# Patient Record
Sex: Male | Born: 1975 | Race: Black or African American | Hispanic: No | Smoking: Former smoker
Health system: Southern US, Community
[De-identification: ages and names within clinical notes are randomized; demographics above are authoritative.]

## PROBLEM LIST (undated history)

## (undated) DIAGNOSIS — I10 Essential (primary) hypertension: Secondary | ICD-10-CM

## (undated) DIAGNOSIS — E1140 Type 2 diabetes mellitus with diabetic neuropathy, unspecified: Secondary | ICD-10-CM

## (undated) DIAGNOSIS — F419 Anxiety disorder, unspecified: Secondary | ICD-10-CM

## (undated) DIAGNOSIS — E7800 Pure hypercholesterolemia, unspecified: Secondary | ICD-10-CM

## (undated) HISTORY — PX: TIBIA FRACTURE SURGERY: SHX806

---

## 2009-08-07 ENCOUNTER — Emergency Department (HOSPITAL_COMMUNITY): Admission: EM | Admit: 2009-08-07 | Discharge: 2009-08-07 | Payer: Self-pay | Admitting: Emergency Medicine

## 2009-11-02 ENCOUNTER — Emergency Department (HOSPITAL_COMMUNITY): Admission: EM | Admit: 2009-11-02 | Discharge: 2009-11-02 | Payer: Self-pay | Admitting: Emergency Medicine

## 2010-04-06 LAB — POCT I-STAT, CHEM 8
Hemoglobin: 17.3 g/dL — ABNORMAL HIGH (ref 13.0–17.0)
Sodium: 140 mEq/L (ref 135–145)
TCO2: 31 mmol/L (ref 0–100)

## 2010-04-06 LAB — URINALYSIS, ROUTINE W REFLEX MICROSCOPIC
Glucose, UA: NEGATIVE mg/dL
Nitrite: NEGATIVE
Urobilinogen, UA: 0.2 mg/dL (ref 0.0–1.0)
pH: 6.5 (ref 5.0–8.0)

## 2010-04-06 LAB — GLUCOSE, CAPILLARY: Glucose-Capillary: 214 mg/dL — ABNORMAL HIGH (ref 70–99)

## 2010-04-08 LAB — POCT I-STAT, CHEM 8
Calcium, Ion: 1.11 mmol/L — ABNORMAL LOW (ref 1.12–1.32)
Hemoglobin: 18 g/dL — ABNORMAL HIGH (ref 13.0–17.0)
Sodium: 137 mEq/L (ref 135–145)
TCO2: 27 mmol/L (ref 0–100)

## 2010-04-08 LAB — RAPID STREP SCREEN (MED CTR MEBANE ONLY): Streptococcus, Group A Screen (Direct): POSITIVE — AB

## 2011-02-18 ENCOUNTER — Emergency Department (HOSPITAL_COMMUNITY): Payer: Self-pay

## 2011-02-18 ENCOUNTER — Other Ambulatory Visit: Payer: Self-pay

## 2011-02-18 ENCOUNTER — Emergency Department (HOSPITAL_COMMUNITY)
Admission: EM | Admit: 2011-02-18 | Discharge: 2011-02-18 | Disposition: A | Discharge status: Home / Self Care | Payer: Self-pay | Attending: Emergency Medicine | Admitting: Emergency Medicine

## 2011-02-18 ENCOUNTER — Encounter (HOSPITAL_COMMUNITY): Payer: Self-pay | Admitting: Emergency Medicine

## 2011-02-18 DIAGNOSIS — J3489 Other specified disorders of nose and nasal sinuses: Secondary | ICD-10-CM | POA: Insufficient documentation

## 2011-02-18 DIAGNOSIS — Z79899 Other long term (current) drug therapy: Secondary | ICD-10-CM | POA: Insufficient documentation

## 2011-02-18 DIAGNOSIS — I10 Essential (primary) hypertension: Secondary | ICD-10-CM | POA: Insufficient documentation

## 2011-02-18 DIAGNOSIS — E7800 Pure hypercholesterolemia, unspecified: Secondary | ICD-10-CM | POA: Insufficient documentation

## 2011-02-18 DIAGNOSIS — R110 Nausea: Secondary | ICD-10-CM | POA: Insufficient documentation

## 2011-02-18 DIAGNOSIS — R079 Chest pain, unspecified: Secondary | ICD-10-CM | POA: Insufficient documentation

## 2011-02-18 DIAGNOSIS — Z7982 Long term (current) use of aspirin: Secondary | ICD-10-CM | POA: Insufficient documentation

## 2011-02-18 DIAGNOSIS — E119 Type 2 diabetes mellitus without complications: Secondary | ICD-10-CM | POA: Insufficient documentation

## 2011-02-18 DIAGNOSIS — R002 Palpitations: Secondary | ICD-10-CM | POA: Insufficient documentation

## 2011-02-18 HISTORY — DX: Pure hypercholesterolemia, unspecified: E78.00

## 2011-02-18 HISTORY — DX: Essential (primary) hypertension: I10

## 2011-02-18 LAB — BASIC METABOLIC PANEL
Calcium: 9.8 mg/dL (ref 8.4–10.5)
GFR calc non Af Amer: 75 mL/min — ABNORMAL LOW (ref >90)
Glucose, Bld: 80 mg/dL (ref 70–99)
Potassium: 3.8 mEq/L (ref 3.5–5.1)
Sodium: 142 mEq/L (ref 135–145)

## 2011-02-18 LAB — DIFFERENTIAL
Basophils Absolute: 0.1 10*3/uL (ref 0.0–0.1)
Eosinophils Absolute: 0.1 10*3/uL (ref 0.0–0.7)
Lymphocytes Relative: 40 % (ref 12–46)
Lymphs Abs: 2.7 10*3/uL (ref 0.7–4.0)
Neutrophils Relative %: 49 % (ref 43–77)

## 2011-02-18 LAB — POCT I-STAT TROPONIN I: Troponin i, poc: 0 ng/mL (ref 0.00–0.08)

## 2011-02-18 LAB — CBC
MCH: 29.9 pg (ref 26.0–34.0)
Platelets: 242 10*3/uL (ref 150–400)
RBC: 5.66 MIL/uL (ref 4.22–5.81)
RDW: 13.1 % (ref 11.5–15.5)
WBC: 6.7 10*3/uL (ref 4.0–10.5)

## 2011-02-18 LAB — GLUCOSE, CAPILLARY: Glucose-Capillary: 78 mg/dL (ref 70–99)

## 2011-02-18 NOTE — ED Provider Notes (Signed)
History     CSN: [id number]  Arrival date & time 02/18/11  2129   First MD Initiated Contact with Patient 02/18/11 2157      Chief Complaint  Patient presents with  . Chest Pain    (Consider location/radiation/quality/duration/timing/severity/associated sxs/prior treatment) Patient is a 36 y.o. male presenting with palpitations. The history is provided by the patient.  Palpitations  This is a chronic problem. The current episode started 3 to 5 hours ago. Episode frequency: 2-3 times per day. The problem has been resolved. The problem is associated with an unknown factor. Associated symptoms include nausea. Pertinent negatives include no fever, no chest pain, no abdominal pain, no vomiting and no cough. He has tried nothing for the symptoms.    Past Medical History  Diagnosis Date  . Hypertension   . Diabetes mellitus   . Hypercholesteremia     History reviewed. No pertinent past surgical history.  No family history on file.  History  Substance Use Topics  . Smoking status: Current Everyday Smoker  . Smokeless tobacco: Not on file  . Alcohol Use: Yes      Review of Systems  Constitutional: Negative for fever.  HENT: Positive for congestion. Negative for facial swelling and trouble swallowing.   Respiratory: Negative for cough.   Cardiovascular: Positive for palpitations. Negative for chest pain.  Gastrointestinal: Positive for nausea. Negative for vomiting, abdominal pain and diarrhea.  Genitourinary: Negative for difficulty urinating.  Skin: Negative for rash.  All other systems reviewed and are negative.    Allergies  Review of patient's allergies indicates no known allergies.  Home Medications   Current Outpatient Rx  Name Route Sig Dispense Refill  . VITAMIN C PO Oral Take 1 tablet by mouth daily.    . ASPIRIN 325 MG PO TBEC Oral Take 325 mg by mouth daily.    Marland Kitchen GLIPIZIDE 5 MG PO TABS Oral Take 5 mg by mouth 2 (two) times daily before a meal.    .  LISINOPRIL 10 MG PO TABS Oral Take 10 mg by mouth daily.    Marland Kitchen METFORMIN HCL 500 MG PO TABS Oral Take 500 mg by mouth 2 (two) times daily with a meal.      BP 124/74  Pulse 89  Temp(Src) 97.8 F (36.6 C) (Oral)  Resp 16  SpO2 100%  Physical Exam  Nursing note and vitals reviewed. Constitutional: He is oriented to person, place, and time. He appears well-developed and well-nourished. No distress.  HENT:  Head: Normocephalic and atraumatic.  Mouth/Throat: Oropharynx is clear and moist.  Eyes: Conjunctivae are normal. Pupils are equal, round, and reactive to light. No scleral icterus.  Neck: Normal range of motion. Neck supple.  Cardiovascular: Normal rate, regular rhythm, normal heart sounds and intact distal pulses.   No murmur heard. Pulmonary/Chest: Effort normal and breath sounds normal. No stridor. No respiratory distress. He has no wheezes. He has no rales.  Abdominal: Soft. He exhibits no distension. There is no tenderness.  Musculoskeletal: Normal range of motion. He exhibits no edema.  Neurological: He is alert and oriented to person, place, and time.  Skin: Skin is warm and dry. No rash noted.  Psychiatric: He has a normal mood and affect. His behavior is normal.    ED Course  Procedures (including critical care time)  Labs Reviewed  BASIC METABOLIC PANEL - Abnormal; Notable for the following:    GFR calc non Af Amer 75 (*)    GFR calc Af Amer 87 (*)  All other components within normal limits  CBC  DIFFERENTIAL  GLUCOSE, CAPILLARY  POCT I-STAT TROPONIN I  I-STAT TROPONIN I   Dg Chest 2 View  02/18/2011  *RADIOLOGY REPORT*  Clinical Data: Cough, congestion, history smoking, hypertension, diabetes  CHEST - 2 VIEW  Comparison: None  Findings: Normal heart size, mediastinal contours, and pulmonary vascularity. Lungs clear. Bones unremarkable. No pneumothorax.  IMPRESSION: Normal exam.  Original Report Authenticated By: Lollie Marrow, M.D.    Date: 02/19/2011  Rate:  83  Rhythm: normal sinus rhythm  QRS Axis: normal  Intervals: normal  ST/T Wave abnormalities: nonspecific ST/T changes  Conduction Disutrbances:none  Narrative Interpretation:   Old EKG Reviewed: none available     1. Palpitations       MDM  36 yo male with hx of palpitations presenting after an episode which occurred a few hours prior to arrival.  Stated he felt that his heart was skipping beats, he then felt heavy chested.  Entire episode lasted seconds and then completely resolved.  On arrival, he was well appearing with stable vitals.  EKG showed nonspecific ST changes.  Labwork obtained in triage was unremarkable including negative troponin.  CXR also normal.  Based on patient's symptoms and presentation, do not suspect ACS.  Patient has had an initial evaluation for these palpitations by a cardiologist, but he did not follow up regarding results of testing.  Feel that he needs follow up and have provided numbers for Cardiology and PCP.  Return precautions given.  DC'd home.    Discussed findings and plan with attending, Dr. Alvester Morin, MD 02/19/11 [phone]

## 2011-02-18 NOTE — ED Provider Notes (Signed)
Complains of irregular heartbeat i.e. skipped beats had 3 or 4 episodes today lasting a split second each also has pain in his left shoulder intermittent which lasts a split second at a time no other complaint. Patient presently asymptomatic. Has had cardiac evaluation including even monitor in the past however he did not follow through with the evaluation and did not get results. Tandem alert awake Glasgow Coma Score 15 lungs clear auscultation heart regular rate and rhythm no murmurs  Doug Sou, MD 02/18/11 2335

## 2011-02-18 NOTE — ED Notes (Signed)
Pt states he has been experiencing midsternal CP that radiates to left shoulder and SOB around 1855.  Pt does state he has acid reflux.  Pt stated he has been feeling "fluttering" in his heart for the past six months to a year.  States he has difficulty catching his breath when he has these episode.  Pt took glipiside and metformin and a bayer asprin today at 2057.

## 2011-02-18 NOTE — ED Notes (Signed)
PT. REPORTS LEFT CHEST PAIN RADIATING TO LEFT SHOULDER / LEFT ARM WITH PALPITATIONS  FOR SEVERAL MONTHS .

## 2011-02-19 NOTE — ED Provider Notes (Signed)
I have personally seen and examined the patient.  I have discussed the plan of care with the resident.  I have reviewed the documentation on PMH/FH/Soc. History.  I have reviewed the documentation of the resident and agree.  Doug Sou, MD 02/19/11 [phone]

## 2012-01-03 ENCOUNTER — Emergency Department (HOSPITAL_BASED_OUTPATIENT_CLINIC_OR_DEPARTMENT_OTHER)
Admission: EM | Admit: 2012-01-03 | Discharge: 2012-01-03 | Disposition: A | Discharge status: Home / Self Care | Payer: Self-pay | Attending: Emergency Medicine | Admitting: Emergency Medicine

## 2012-01-03 ENCOUNTER — Encounter (HOSPITAL_BASED_OUTPATIENT_CLINIC_OR_DEPARTMENT_OTHER): Payer: Self-pay | Admitting: *Deleted

## 2012-01-03 ENCOUNTER — Emergency Department (HOSPITAL_BASED_OUTPATIENT_CLINIC_OR_DEPARTMENT_OTHER): Payer: Self-pay

## 2012-01-03 DIAGNOSIS — M549 Dorsalgia, unspecified: Secondary | ICD-10-CM

## 2012-01-03 DIAGNOSIS — E119 Type 2 diabetes mellitus without complications: Secondary | ICD-10-CM | POA: Insufficient documentation

## 2012-01-03 DIAGNOSIS — R05 Cough: Secondary | ICD-10-CM | POA: Insufficient documentation

## 2012-01-03 DIAGNOSIS — R059 Cough, unspecified: Secondary | ICD-10-CM | POA: Insufficient documentation

## 2012-01-03 DIAGNOSIS — I10 Essential (primary) hypertension: Secondary | ICD-10-CM | POA: Insufficient documentation

## 2012-01-03 DIAGNOSIS — F17200 Nicotine dependence, unspecified, uncomplicated: Secondary | ICD-10-CM | POA: Insufficient documentation

## 2012-01-03 DIAGNOSIS — M545 Low back pain, unspecified: Secondary | ICD-10-CM | POA: Insufficient documentation

## 2012-01-03 DIAGNOSIS — Z79899 Other long term (current) drug therapy: Secondary | ICD-10-CM | POA: Insufficient documentation

## 2012-01-03 DIAGNOSIS — Z7982 Long term (current) use of aspirin: Secondary | ICD-10-CM | POA: Insufficient documentation

## 2012-01-03 DIAGNOSIS — IMO0001 Reserved for inherently not codable concepts without codable children: Secondary | ICD-10-CM | POA: Insufficient documentation

## 2012-01-03 DIAGNOSIS — E7800 Pure hypercholesterolemia, unspecified: Secondary | ICD-10-CM | POA: Insufficient documentation

## 2012-01-03 LAB — URINALYSIS, ROUTINE W REFLEX MICROSCOPIC
Bilirubin Urine: NEGATIVE
Hgb urine dipstick: NEGATIVE
Ketones, ur: NEGATIVE mg/dL
Nitrite: NEGATIVE
Protein, ur: NEGATIVE mg/dL
Specific Gravity, Urine: 1.029 (ref 1.005–1.030)
Urobilinogen, UA: 0.2 mg/dL (ref 0.0–1.0)

## 2012-01-03 MED ORDER — HYDROCODONE-ACETAMINOPHEN 5-325 MG PO TABS: 2.0000 | ORAL_TABLET | ORAL | Status: DC | PRN

## 2012-01-03 NOTE — ED Provider Notes (Signed)
Medical screening examination/treatment/procedure(s) were performed by non-physician practitioner and as supervising physician I was immediately available for consultation/collaboration.   Carleene Cooper III, MD 01/03/12 [phone]

## 2012-01-03 NOTE — ED Provider Notes (Signed)
History     CSN: [id number]  Arrival date & time 01/03/12  1431   First MD Initiated Contact with Patient 01/03/12 1456      Chief Complaint  Patient presents with  . Back Pain    (Consider location/radiation/quality/duration/timing/severity/associated sxs/prior treatment) HPI Comments: Pt states that he has intermittent history of back pain in lower back over the last couple of years:pt states that he is having some generalized myalgias, cough and back pain  Patient is a 36 y.o. male presenting with back pain. The history is provided by the patient. No language interpreter was used.  Back Pain  This is a chronic problem. The current episode started more than 1 week ago. The problem occurs constantly. The problem has not changed since onset.The pain is associated with no known injury. The pain is present in the lumbar spine. The quality of the pain is described as aching. The pain does not radiate. The pain is moderate. The symptoms are aggravated by bending. The pain is the same all the time. Pertinent negatives include no numbness, no bowel incontinence, no perianal numbness, no dysuria, no paresis, no tingling and no weakness. He has tried nothing for the symptoms.    Past Medical History  Diagnosis Date  . Hypertension   . Diabetes mellitus   . Hypercholesteremia     History reviewed. No pertinent past surgical history.  History reviewed. No pertinent family history.  History  Substance Use Topics  . Smoking status: Current Every Day Smoker  . Smokeless tobacco: Not on file  . Alcohol Use: Yes      Review of Systems  Constitutional: Negative for fatigue.  Respiratory: Positive for cough.   Cardiovascular: Negative.   Gastrointestinal: Negative for bowel incontinence.  Genitourinary: Negative for dysuria.  Musculoskeletal: Positive for back pain.  Neurological: Negative for tingling, weakness and numbness.    Allergies  Review of patient's allergies indicates  no known allergies.  Home Medications   Current Outpatient Rx  Name  Route  Sig  Dispense  Refill  . VITAMIN C PO   Oral   Take 1 tablet by mouth daily.         . ASPIRIN 325 MG PO TBEC   Oral   Take 325 mg by mouth daily.         Marland Kitchen GLIPIZIDE 5 MG PO TABS   Oral   Take 5 mg by mouth 2 (two) times daily before a meal.         . LISINOPRIL 10 MG PO TABS   Oral   Take 10 mg by mouth daily.         Marland Kitchen METFORMIN HCL 500 MG PO TABS   Oral   Take 500 mg by mouth 2 (two) times daily with a meal.           BP 130/78  Pulse 87  Temp 98 F (36.7 C) (Oral)  Resp 16  SpO2 98%  Physical Exam  Constitutional: He is oriented to person, place, and time. He appears well-developed and well-nourished.  HENT:  Head: Normocephalic and atraumatic.  Right Ear: External ear normal.  Left Ear: External ear normal.  Nose: Nose normal.  Eyes: Conjunctivae normal and EOM are normal.  Neck: Neck supple.  Pulmonary/Chest: Effort normal and breath sounds normal.  Abdominal: Soft. Bowel sounds are normal. There is no tenderness.  Musculoskeletal:       Lumbar paraspinal tenderness:pt has full rom:pt has equal strength in lower extremities  Neurological: He is alert and oriented to person, place, and time.  Skin: Skin is warm and dry.  Psychiatric: He has a normal mood and affect.    ED Course  Procedures (including critical care time)   Labs Reviewed  URINALYSIS, ROUTINE W REFLEX MICROSCOPIC   Dg Chest 2 View  01/03/2012  *RADIOLOGY REPORT*  Clinical Data: Cough, smoking history, low back pain  CHEST - 2 VIEW  Comparison: Chest x-ray of 02/18/2011  Findings: No active infiltrate or effusion is seen.  Mediastinal contours are stable.  The heart is stable in size and configuration.  No bony abnormality is seen.  IMPRESSION: Stable chest x-ray.  No active lung disease.   Original Report Authenticated By: Dwyane Dee, M.D.      1. Back pain       MDM  Pt is okay to follow  up with pcp:pt not having any neuro deficits        Teressa Lower, NP 01/03/12 1613

## 2012-01-03 NOTE — ED Notes (Signed)
Patient transported to X-ray 

## 2012-01-03 NOTE — ED Notes (Signed)
Pt denies any injury or trauma, denies any fevers or any urinary symptoms. States this is the same low back pain he always has, "only worse".

## 2012-01-03 NOTE — ED Notes (Signed)
BS 150

## 2012-01-03 NOTE — ED Notes (Signed)
NP at bedside.

## 2012-01-03 NOTE — ED Notes (Signed)
Pt amb to triage with quick steady gait in nad. 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 Last night started having lbp in the middle of his back.

## 2012-06-20 ENCOUNTER — Emergency Department (HOSPITAL_BASED_OUTPATIENT_CLINIC_OR_DEPARTMENT_OTHER)
Admission: EM | Admit: 2012-06-20 | Discharge: 2012-06-20 | Disposition: A | Discharge status: Home / Self Care | Payer: Medicaid Other | Attending: Emergency Medicine | Admitting: Emergency Medicine

## 2012-06-20 ENCOUNTER — Encounter (HOSPITAL_BASED_OUTPATIENT_CLINIC_OR_DEPARTMENT_OTHER): Payer: Self-pay | Admitting: *Deleted

## 2012-06-20 DIAGNOSIS — Z862 Personal history of diseases of the blood and blood-forming organs and certain disorders involving the immune mechanism: Secondary | ICD-10-CM | POA: Insufficient documentation

## 2012-06-20 DIAGNOSIS — F17200 Nicotine dependence, unspecified, uncomplicated: Secondary | ICD-10-CM | POA: Insufficient documentation

## 2012-06-20 DIAGNOSIS — Z79899 Other long term (current) drug therapy: Secondary | ICD-10-CM | POA: Insufficient documentation

## 2012-06-20 DIAGNOSIS — I10 Essential (primary) hypertension: Secondary | ICD-10-CM | POA: Insufficient documentation

## 2012-06-20 DIAGNOSIS — Z8639 Personal history of other endocrine, nutritional and metabolic disease: Secondary | ICD-10-CM | POA: Insufficient documentation

## 2012-06-20 DIAGNOSIS — R599 Enlarged lymph nodes, unspecified: Secondary | ICD-10-CM | POA: Insufficient documentation

## 2012-06-20 DIAGNOSIS — R5383 Other fatigue: Secondary | ICD-10-CM | POA: Insufficient documentation

## 2012-06-20 DIAGNOSIS — Z7982 Long term (current) use of aspirin: Secondary | ICD-10-CM | POA: Insufficient documentation

## 2012-06-20 DIAGNOSIS — J020 Streptococcal pharyngitis: Secondary | ICD-10-CM

## 2012-06-20 DIAGNOSIS — E119 Type 2 diabetes mellitus without complications: Secondary | ICD-10-CM | POA: Insufficient documentation

## 2012-06-20 DIAGNOSIS — R5381 Other malaise: Secondary | ICD-10-CM | POA: Insufficient documentation

## 2012-06-20 LAB — RAPID STREP SCREEN (MED CTR MEBANE ONLY): Streptococcus, Group A Screen (Direct): POSITIVE — AB

## 2012-06-20 MED ORDER — AMOXICILLIN 500 MG PO CAPS: 500.0000 mg | ORAL_CAPSULE | Freq: Three times a day (TID) | ORAL | Status: DC

## 2012-06-20 NOTE — ED Provider Notes (Signed)
History     CSN: [id number]  Arrival date & time 06/20/12  1949   First MD Initiated Contact with Patient 06/20/12 2012      Chief Complaint  Patient presents with  . Sore Throat    (Consider location/radiation/quality/duration/timing/severity/associated sxs/prior treatment) HPI Comments: 37 year old male presents emergency department complaining of sore throat x2 days. States the pain has been progressively worsening for the past 2 days and is beginning to feel tired. He has tried to use throat spray and throat drops without relief. Denies difficulty breathing, swallowing or voice change. He feels as if his glands are swollen. States his father's girlfriend has been sick recently and he shared a cigarette near her the other day. Denies fever, chills, headache, ear pain or any other symptoms.  Patient is a 37 y.o. male presenting with pharyngitis. The history is provided by the patient.  Sore Throat Associated symptoms include fatigue and a sore throat. Pertinent negatives include no chills, fever or neck pain.    Past Medical History  Diagnosis Date  . Hypertension   . Diabetes mellitus   . Hypercholesteremia     History reviewed. No pertinent past surgical history.  History reviewed. No pertinent family history.  History  Substance Use Topics  . Smoking status: Current Some Day Smoker  . Smokeless tobacco: Not on file  . Alcohol Use: Yes      Review of Systems  Constitutional: Positive for fatigue. Negative for fever and chills.  HENT: Positive for sore throat. Negative for ear pain, trouble swallowing, neck pain, neck stiffness and voice change.   Hematological: Positive for adenopathy.  All other systems reviewed and are negative.    Allergies  Review of patient's allergies indicates no known allergies.  Home Medications   Current Outpatient Rx  Name  Route  Sig  Dispense  Refill  . amoxicillin (AMOXIL) 500 MG capsule   Oral   Take 1 capsule (500 mg  total) by mouth 3 (three) times daily.   21 capsule   0   . Ascorbic Acid (VITAMIN C PO)   Oral   Take 1 tablet by mouth daily.         Marland Kitchen aspirin 325 MG EC tablet   Oral   Take 325 mg by mouth daily.         Marland Kitchen glipiZIDE (GLUCOTROL) 5 MG tablet   Oral   Take 5 mg by mouth 2 (two) times daily before a meal.         . HYDROcodone-acetaminophen (NORCO/VICODIN) 5-325 MG per tablet   Oral   Take 2 tablets by mouth every 4 (four) hours as needed for pain.   10 tablet   0   . lisinopril (PRINIVIL,ZESTRIL) 10 MG tablet   Oral   Take 10 mg by mouth daily.         . metFORMIN (GLUCOPHAGE) 500 MG tablet   Oral   Take 500 mg by mouth 2 (two) times daily with a meal.           BP 129/86  Pulse 81  Temp(Src) 98.4 F (36.9 C) (Oral)  Resp 16  Ht 6\' 6"  (1.981 m)  Wt 265 lb (120.203 kg)  BMI 30.63 kg/m2  SpO2 98%  Physical Exam  Nursing note and vitals reviewed. Constitutional: He is oriented to person, place, and time. He appears well-developed and well-nourished. No distress.  HENT:  Head: Normocephalic and atraumatic.  Mouth/Throat: Uvula is midline and mucous membranes are normal.  No edematous. Oropharyngeal exudate, posterior oropharyngeal edema and posterior oropharyngeal erythema present. No tonsillar abscesses.  Eyes: Conjunctivae are normal.  Neck: Normal range of motion. Neck supple.  Cardiovascular: Normal rate, regular rhythm and normal heart sounds.   Pulmonary/Chest: Effort normal and breath sounds normal. No respiratory distress. He has no wheezes.  Musculoskeletal: Normal range of motion. He exhibits no edema.  Lymphadenopathy:       Head (right side): Tonsillar adenopathy present.       Head (left side): Tonsillar adenopathy present.  Neurological: He is alert and oriented to person, place, and time.  Skin: Skin is warm and dry. He is not diaphoretic.  Psychiatric: He has a normal mood and affect. His behavior is normal.    ED Course  Procedures  (including critical care time)  Labs Reviewed  RAPID STREP SCREEN - Abnormal; Notable for the following:    Streptococcus, Group A Screen (Direct) POSITIVE (*)    All other components within normal limits   No results found.   1. Strep throat       MDM  37 year old male with strep throat. No apparent distress comfortable vital signs. Will treat with amoxicillin. Advised ibuprofen for pain along with rest and increased fluids. Return precautions discussed. Patient states understanding of plan and is agreeable to        Trevor Mace, Cordelia Poche 06/20/12 2034

## 2012-06-20 NOTE — ED Provider Notes (Signed)
Medical screening examination/treatment/procedure(s) were performed by non-physician practitioner and as supervising physician I was immediately available for consultation/collaboration.  Shelma Eiben, MD 06/20/12 2249 

## 2012-06-20 NOTE — ED Notes (Signed)
Pt c/o sore throat x2 days 

## 2012-07-20 ENCOUNTER — Encounter (HOSPITAL_BASED_OUTPATIENT_CLINIC_OR_DEPARTMENT_OTHER): Payer: Self-pay | Admitting: *Deleted

## 2012-07-20 ENCOUNTER — Emergency Department (HOSPITAL_BASED_OUTPATIENT_CLINIC_OR_DEPARTMENT_OTHER)
Admission: EM | Admit: 2012-07-20 | Discharge: 2012-07-20 | Disposition: A | Discharge status: Home / Self Care | Payer: Medicaid Other | Attending: Emergency Medicine | Admitting: Emergency Medicine

## 2012-07-20 DIAGNOSIS — E785 Hyperlipidemia, unspecified: Secondary | ICD-10-CM | POA: Insufficient documentation

## 2012-07-20 DIAGNOSIS — R5381 Other malaise: Secondary | ICD-10-CM | POA: Insufficient documentation

## 2012-07-20 DIAGNOSIS — R002 Palpitations: Secondary | ICD-10-CM | POA: Insufficient documentation

## 2012-07-20 DIAGNOSIS — I10 Essential (primary) hypertension: Secondary | ICD-10-CM | POA: Insufficient documentation

## 2012-07-20 DIAGNOSIS — F17200 Nicotine dependence, unspecified, uncomplicated: Secondary | ICD-10-CM | POA: Insufficient documentation

## 2012-07-20 DIAGNOSIS — E119 Type 2 diabetes mellitus without complications: Secondary | ICD-10-CM | POA: Insufficient documentation

## 2012-07-20 DIAGNOSIS — Z79899 Other long term (current) drug therapy: Secondary | ICD-10-CM | POA: Insufficient documentation

## 2012-07-20 DIAGNOSIS — F411 Generalized anxiety disorder: Secondary | ICD-10-CM | POA: Insufficient documentation

## 2012-07-20 HISTORY — DX: Anxiety disorder, unspecified: F41.9

## 2012-07-20 NOTE — ED Notes (Signed)
Patient stated that he felt better and was not waiting, patient informed that labs were to be drawn at MD request, but patient left with spouse. Ambulating w/o difficulty, did not sign AMA form

## 2012-07-20 NOTE — ED Notes (Addendum)
Patient here with c/o sore throat and irregular heartbeat.  Patient was not taking his antibiotics according to the directions since he lost them.  Once he found them, he started retaking his antibiotics. Patient has anxiety disorder and today he experienced his  heart racing with shortness of breath PTA.  Patient takes xanax for his anxiety but is out of them.  Patient states that he feels better now.  Patient took 2 81mg  of bayer aspirin PTA

## 2012-07-20 NOTE — ED Provider Notes (Signed)
History    CSN: [id number] Arrival date & time 07/20/12  1322  First MD Initiated Contact with Patient 07/20/12 1435     Chief Complaint  Patient presents with  . Irregular Heart Beat   (Consider location/radiation/quality/duration/timing/severity/associated sxs/prior Treatment) HPI This 37 year old male is had intermittent palpitations for years, he has had a heart monitors at home in the past, no specific diagnosis, today he had 2 episodes each lasting a few minutes of sudden onset lightheadedness generalized weakness generalized shaking palpitations but no chest pain no cough no fever no change in speech vision swallowing or understanding no focal or lateralizing weakness numbness or incoordination in his symptoms have completely resolved, few days he ago he had a sore throat that is now resolved about a month ago he had a sore throat that resolved in a few days, he felt anxious shaky all over today with slight shortness of breath as well but is not short of breath now and his symptoms have completely resolved, he is not suicidal homicidal or hallucinating. There is no treatment prior to arrival. He has had episodes like this multiple times in the past. He is PERC negative. Past Medical History  Diagnosis Date  . Hypertension   . Diabetes mellitus   . Hypercholesteremia   . Anxiety    History reviewed. No pertinent past surgical history. History reviewed. No pertinent family history. History  Substance Use Topics  . Smoking status: Current Some Day Smoker  . Smokeless tobacco: Not on file  . Alcohol Use: Yes    Review of Systems 10 Systems reviewed and are negative for acute change except as noted in the HPI. Allergies  Review of patient's allergies indicates no known allergies.  Home Medications   Current Outpatient Rx  Name  Route  Sig  Dispense  Refill  . ALPRAZolam (XANAX) 0.5 MG tablet   Oral   Take 0.5 mg by mouth at bedtime as needed for sleep.         Marland Kitchen  amoxicillin (AMOXIL) 500 MG capsule   Oral   Take 1 capsule (500 mg total) by mouth 3 (three) times daily.   21 capsule   0   . Ascorbic Acid (VITAMIN C PO)   Oral   Take 1 tablet by mouth daily.         Marland Kitchen aspirin 325 MG EC tablet   Oral   Take 325 mg by mouth daily.         Marland Kitchen glipiZIDE (GLUCOTROL) 5 MG tablet   Oral   Take 5 mg by mouth 2 (two) times daily before a meal.         . HYDROcodone-acetaminophen (NORCO/VICODIN) 5-325 MG per tablet   Oral   Take 2 tablets by mouth every 4 (four) hours as needed for pain.   10 tablet   0   . lisinopril (PRINIVIL,ZESTRIL) 10 MG tablet   Oral   Take 10 mg by mouth daily.         . metFORMIN (GLUCOPHAGE) 500 MG tablet   Oral   Take 500 mg by mouth 2 (two) times daily with a meal.          Temp(Src) 97.7 F (36.5 C) (Oral)  Resp 18  Ht 6\' 6"  (1.981 m)  Wt 253 lb 9.6 oz (115.032 kg)  BMI 29.31 kg/m2  SpO2 100% Physical Exam  Nursing note and vitals reviewed. Constitutional:  Awake, alert, nontoxic appearance with baseline speech for patient.  HENT:  Head: Atraumatic.  Mouth/Throat: Oropharynx is clear and moist. No oropharyngeal exudate.  Eyes: EOM are normal. Pupils are equal, round, and reactive to light. Right eye exhibits no discharge. Left eye exhibits no discharge.  Neck: Neck supple.  Cardiovascular: Normal rate and regular rhythm.   No murmur heard. Pulmonary/Chest: Effort normal and breath sounds normal. No stridor. No respiratory distress. He has no wheezes. He has no rales. He exhibits no tenderness.  Abdominal: Soft. Bowel sounds are normal. He exhibits no mass. There is no tenderness. There is no rebound.  Musculoskeletal: He exhibits no edema and no tenderness.  Baseline ROM, moves extremities with no obvious new focal weakness.  Lymphadenopathy:    He has no cervical adenopathy.  Neurological:  Awake, alert, cooperative and aware of situation; motor strength bilaterally; sensation normal to light  touch bilaterally; peripheral visual fields full to confrontation; no facial asymmetry; tongue midline; major cranial nerves appear intact; no pronator drift, normal finger to nose bilaterally, baseline gait without new ataxia.  Skin: No rash noted.  Psychiatric: He has a normal mood and affect.    ED Course  Procedures (including critical care time) ECG: Normal sinus rhythm, rate 70, normal axis, diffuse slight ST elevation, no significant change noted compared with January 2013, suspect early repolarization Labs Reviewed - No data to display No results found. 1. Palpitations     MDM  Unbeknownst to MD Pt eloped.  Hurman Horn, MD 07/20/12 2103

## 2012-08-06 ENCOUNTER — Emergency Department (HOSPITAL_BASED_OUTPATIENT_CLINIC_OR_DEPARTMENT_OTHER): Payer: Self-pay

## 2012-08-06 ENCOUNTER — Emergency Department (HOSPITAL_BASED_OUTPATIENT_CLINIC_OR_DEPARTMENT_OTHER)
Admission: EM | Admit: 2012-08-06 | Discharge: 2012-08-06 | Disposition: A | Discharge status: Home / Self Care | Payer: Self-pay | Attending: Emergency Medicine | Admitting: Emergency Medicine

## 2012-08-06 ENCOUNTER — Encounter (HOSPITAL_BASED_OUTPATIENT_CLINIC_OR_DEPARTMENT_OTHER): Payer: Self-pay | Admitting: *Deleted

## 2012-08-06 DIAGNOSIS — I10 Essential (primary) hypertension: Secondary | ICD-10-CM | POA: Insufficient documentation

## 2012-08-06 DIAGNOSIS — F411 Generalized anxiety disorder: Secondary | ICD-10-CM | POA: Insufficient documentation

## 2012-08-06 DIAGNOSIS — F17200 Nicotine dependence, unspecified, uncomplicated: Secondary | ICD-10-CM | POA: Insufficient documentation

## 2012-08-06 DIAGNOSIS — Z79899 Other long term (current) drug therapy: Secondary | ICD-10-CM | POA: Insufficient documentation

## 2012-08-06 DIAGNOSIS — Z8639 Personal history of other endocrine, nutritional and metabolic disease: Secondary | ICD-10-CM | POA: Insufficient documentation

## 2012-08-06 DIAGNOSIS — R109 Unspecified abdominal pain: Secondary | ICD-10-CM | POA: Insufficient documentation

## 2012-08-06 DIAGNOSIS — Z7982 Long term (current) use of aspirin: Secondary | ICD-10-CM | POA: Insufficient documentation

## 2012-08-06 DIAGNOSIS — Z862 Personal history of diseases of the blood and blood-forming organs and certain disorders involving the immune mechanism: Secondary | ICD-10-CM | POA: Insufficient documentation

## 2012-08-06 DIAGNOSIS — Z87442 Personal history of urinary calculi: Secondary | ICD-10-CM | POA: Insufficient documentation

## 2012-08-06 DIAGNOSIS — E119 Type 2 diabetes mellitus without complications: Secondary | ICD-10-CM | POA: Insufficient documentation

## 2012-08-06 LAB — URINALYSIS, ROUTINE W REFLEX MICROSCOPIC
Bilirubin Urine: NEGATIVE
Ketones, ur: NEGATIVE mg/dL
Leukocytes, UA: NEGATIVE
Nitrite: NEGATIVE
Protein, ur: NEGATIVE mg/dL
Urobilinogen, UA: 0.2 mg/dL (ref 0.0–1.0)

## 2012-08-06 MED ORDER — PROMETHAZINE HCL 25 MG PO TABS: 25.0000 mg | ORAL_TABLET | Freq: Four times a day (QID) | ORAL | Status: DC | PRN

## 2012-08-06 MED ORDER — ACETAMINOPHEN-CODEINE #3 300-30 MG PO TABS: 1.0000 | ORAL_TABLET | Freq: Four times a day (QID) | ORAL | Status: DC | PRN

## 2012-08-06 NOTE — ED Provider Notes (Signed)
History    CSN: [id number] Arrival date & time 08/06/12  0024  First MD Initiated Contact with Patient 08/06/12 0054     Chief Complaint  Patient presents with  . Flank Pain   (Consider location/radiation/quality/duration/timing/severity/associated sxs/prior Treatment) HPI Comments: Pt with no hx of renal stones comes in with flank pain. Sudden onset earlier in the day, moderately severe, with no uti like sx. The pain stays in the right flank region, and is occasionally worse with movement. No trauma. No hx of similar pain.   Patient is a 37 y.o. male presenting with flank pain. The history is provided by the patient.  Flank Pain This is a new problem. The current episode started 12 to 24 hours ago. The problem occurs constantly. The problem has not changed since onset.Pertinent negatives include no chest pain, no abdominal pain and no shortness of breath. Nothing aggravates the symptoms. Nothing relieves the symptoms. He has tried nothing for the symptoms.   Past Medical History  Diagnosis Date  . Hypertension   . Diabetes mellitus   . Hypercholesteremia   . Anxiety    History reviewed. No pertinent past surgical history. History reviewed. No pertinent family history. History  Substance Use Topics  . Smoking status: Current Some Day Smoker -- 0.50 packs/day    Types: Cigarettes  . Smokeless tobacco: Not on file  . Alcohol Use: Yes    Review of Systems  Constitutional: Negative for activity change and appetite change.  Respiratory: Negative for cough and shortness of breath.   Cardiovascular: Negative for chest pain.  Gastrointestinal: Negative for abdominal pain.  Genitourinary: Positive for flank pain. Negative for dysuria.    Allergies  Review of patient's allergies indicates no known allergies.  Home Medications   Current Outpatient Rx  Name  Route  Sig  Dispense  Refill  . acetaminophen-codeine (TYLENOL #3) 300-30 MG per tablet   Oral   Take 1-2 tablets by  mouth every 6 (six) hours as needed for pain.   15 tablet   0   . ALPRAZolam (XANAX) 0.5 MG tablet   Oral   Take 0.5 mg by mouth at bedtime as needed for sleep.         Marland Kitchen aspirin 325 MG EC tablet   Oral   Take 325 mg by mouth daily.         Marland Kitchen glipiZIDE (GLUCOTROL) 5 MG tablet   Oral   Take 5 mg by mouth 2 (two) times daily before a meal.         . lisinopril (PRINIVIL,ZESTRIL) 10 MG tablet   Oral   Take 10 mg by mouth daily.         . metFORMIN (GLUCOPHAGE) 500 MG tablet   Oral   Take 500 mg by mouth 2 (two) times daily with a meal.         . promethazine (PHENERGAN) 25 MG tablet   Oral   Take 1 tablet (25 mg total) by mouth every 6 (six) hours as needed for nausea.   30 tablet   0    BP 123/75  Pulse 81  Temp(Src) 99.1 F (37.3 C) (Oral)  Resp 16  Ht 6\' 6"  (1.981 m)  Wt 255 lb (115.667 kg)  BMI 29.47 kg/m2  SpO2 99% Physical Exam  Constitutional: He is oriented to person, place, and time. He appears well-developed.  HENT:  Head: Normocephalic and atraumatic.  Eyes: Conjunctivae and EOM are normal. Pupils are equal, round, and  reactive to light.  Neck: Normal range of motion. Neck supple.  Cardiovascular: Normal rate and regular rhythm.   Pulmonary/Chest: Effort normal and breath sounds normal.  Abdominal: Soft. Bowel sounds are normal. He exhibits no distension. There is no tenderness. There is no rebound and no guarding.  Genitourinary:  Right flank tenderness  Neurological: He is alert and oriented to person, place, and time.  Skin: Skin is warm. No rash noted.    ED Course  Procedures (including critical care time) Labs Reviewed  URINE CULTURE  URINALYSIS, ROUTINE W REFLEX MICROSCOPIC   Ct Abdomen Pelvis Wo Contrast  08/06/2012   *RADIOLOGY REPORT*  Clinical Data: Right flank pain  CT ABDOMEN AND PELVIS WITHOUT CONTRAST  Technique:  Multidetector CT imaging of the abdomen and pelvis was performed following the standard protocol without  intravenous contrast.  Comparison: None.  Findings: Lung bases clear.  Normal heart size.  No pericardial or pleural effusion.  No hiatal hernia.  Abdomen:  Kidneys demonstrate no acute obstruction, hydronephrosis, or perinephric inflammatory process.  Ureters are symmetric and decompressed.  No hydroureter or ureteral calculus on either side. Punctate sub-centimeter nonobstructing intrarenal calculus in the right kidney lower pole.  Liver, collapsed gallbladder, biliary system, pancreas, spleen, and adrenal glands are within normal limits for noncontrast study and demonstrate no acute process.  Negative for bowel obstruction, dilatation, ileus, or free air. Normal appendix.  No abdominal free fluid, fluid collection, hemorrhage, adenopathy, or abscess.  Scattered colonic diverticulosis.  Pelvis:  No pelvic free fluid, fluid collection, hemorrhage, adenopathy, inguinal abnormality, or hernia.  Urinary bladder unremarkable.  Pelvic calcifications consistent with venous phleboliths.  No acute distal bowel process.  No acute osseous finding.  Left ilium demonstrates a hypodense lytic or cystic bone lesion with well-circumscribed margins measures 15 mm, image 67.  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  Intact overlying cortex.  No other osseous abnormality.  Prominent Schmorl's node noted at the L4 inferior endplate.  IMPRESSION: No acute obstructing ureteral or urinary tract calculi.  No acute hydronephrosis or obstructive uropathy.  Incidental punctate sub-centimeter right intrarenal calculus.  Colonic diverticulosis without acute inflammatory process  Normal appendix  No acute intra-abdominal or pelvic finding  Nonspecific 15 mm left iliac lytic or lucent bone lesion, suspect benign.   Original Report Authenticated By: Judie Petit. Shick, M.D.   1. Flank pain     MDM  Pt comes in with cc of flank pain. Right sided, moderately severe, constant. No hx of renal stones.. Pt is diabetic, no uti like sx, ua was clean, culture  sent. Pt's CT showed no renal stones, no evidence of infection. Unsure etiology at this time, but return precautions discussed.   Derwood Kaplan, MD 08/06/12 2314

## 2012-08-06 NOTE — ED Notes (Signed)
Patient transported to CT 

## 2012-08-06 NOTE — ED Notes (Signed)
Pt c/o right flank and lower back pain  X 1 day

## 2012-08-07 LAB — URINE CULTURE

## 2012-09-04 ENCOUNTER — Emergency Department (HOSPITAL_BASED_OUTPATIENT_CLINIC_OR_DEPARTMENT_OTHER)
Admission: EM | Admit: 2012-09-04 | Discharge: 2012-09-04 | Disposition: A | Discharge status: Home / Self Care | Payer: Self-pay | Attending: Emergency Medicine | Admitting: Emergency Medicine

## 2012-09-04 ENCOUNTER — Encounter (HOSPITAL_BASED_OUTPATIENT_CLINIC_OR_DEPARTMENT_OTHER): Payer: Self-pay | Admitting: Student

## 2012-09-04 DIAGNOSIS — J029 Acute pharyngitis, unspecified: Secondary | ICD-10-CM | POA: Insufficient documentation

## 2012-09-04 DIAGNOSIS — R059 Cough, unspecified: Secondary | ICD-10-CM | POA: Insufficient documentation

## 2012-09-04 DIAGNOSIS — R05 Cough: Secondary | ICD-10-CM | POA: Insufficient documentation

## 2012-09-04 DIAGNOSIS — F17200 Nicotine dependence, unspecified, uncomplicated: Secondary | ICD-10-CM | POA: Insufficient documentation

## 2012-09-04 DIAGNOSIS — E119 Type 2 diabetes mellitus without complications: Secondary | ICD-10-CM | POA: Insufficient documentation

## 2012-09-04 DIAGNOSIS — F411 Generalized anxiety disorder: Secondary | ICD-10-CM | POA: Insufficient documentation

## 2012-09-04 DIAGNOSIS — Z8639 Personal history of other endocrine, nutritional and metabolic disease: Secondary | ICD-10-CM | POA: Insufficient documentation

## 2012-09-04 DIAGNOSIS — Z79899 Other long term (current) drug therapy: Secondary | ICD-10-CM | POA: Insufficient documentation

## 2012-09-04 DIAGNOSIS — Z862 Personal history of diseases of the blood and blood-forming organs and certain disorders involving the immune mechanism: Secondary | ICD-10-CM | POA: Insufficient documentation

## 2012-09-04 DIAGNOSIS — Z7982 Long term (current) use of aspirin: Secondary | ICD-10-CM | POA: Insufficient documentation

## 2012-09-04 DIAGNOSIS — I10 Essential (primary) hypertension: Secondary | ICD-10-CM | POA: Insufficient documentation

## 2012-09-04 LAB — RAPID STREP SCREEN (MED CTR MEBANE ONLY): Streptococcus, Group A Screen (Direct): NEGATIVE

## 2012-09-04 MED ORDER — IBUPROFEN 400 MG PO TABS
600.0000 mg | ORAL_TABLET | Freq: Once | ORAL | Status: AC
  Administered 2012-09-04: 600 mg via ORAL
  Filled 2012-09-04: qty 1

## 2012-09-04 MED ORDER — BENZONATATE 100 MG PO CAPS
100.0000 mg | ORAL_CAPSULE | Freq: Once | ORAL | Status: AC
  Administered 2012-09-04: 100 mg via ORAL
  Filled 2012-09-04: qty 1

## 2012-09-04 MED ORDER — BENZONATATE 100 MG PO CAPS: 100.0000 mg | ORAL_CAPSULE | Freq: Three times a day (TID) | ORAL | Status: DC

## 2012-09-04 NOTE — ED Notes (Signed)
Pt refused Rx for Tessalon and verbalized understanding to take OTC ibuprofen as directed on the label.

## 2012-09-04 NOTE — ED Provider Notes (Signed)
CSN: [id number]     Arrival date & time 09/04/12  1851 History     First MD Initiated Contact with Patient 09/04/12 1856     Chief Complaint  Patient presents with  . Sore Throat   (Consider location/radiation/quality/duration/timing/severity/associated sxs/prior Treatment) Patient is a 37 y.o. male presenting with pharyngitis.  Sore Throat This is a new problem. The current episode started yesterday. The problem occurs constantly. The problem has been gradually worsening. Pertinent negatives include no chest pain, no abdominal pain, no headaches and no shortness of breath. Associated symptoms comments: Cough. The symptoms are aggravated by swallowing. Nothing relieves the symptoms. He has tried nothing for the symptoms.    Past Medical History  Diagnosis Date  . Hypertension   . Diabetes mellitus   . Hypercholesteremia   . Anxiety    History reviewed. No pertinent past surgical history. History reviewed. No pertinent family history. History  Substance Use Topics  . Smoking status: Current Some Day Smoker -- 0.50 packs/day    Types: Cigarettes  . Smokeless tobacco: Not on file  . Alcohol Use: Yes    Review of Systems  Constitutional: Negative for fever.  HENT: Negative for congestion.   Respiratory: Negative for cough and shortness of breath.   Cardiovascular: Negative for chest pain.  Gastrointestinal: Negative for nausea, vomiting, abdominal pain and diarrhea.  Neurological: Negative for headaches.  All other systems reviewed and are negative.    Allergies  Review of patient's allergies indicates no known allergies.  Home Medications   Current Outpatient Rx  Name  Route  Sig  Dispense  Refill  . acetaminophen-codeine (TYLENOL #3) 300-30 MG per tablet   Oral   Take 1-2 tablets by mouth every 6 (six) hours as needed for pain.   15 tablet   0   . ALPRAZolam (XANAX) 0.5 MG tablet   Oral   Take 0.5 mg by mouth at bedtime as needed for sleep.         Marland Kitchen  aspirin 325 MG EC tablet   Oral   Take 325 mg by mouth daily.         Marland Kitchen glipiZIDE (GLUCOTROL) 5 MG tablet   Oral   Take 5 mg by mouth 2 (two) times daily before a meal.         . lisinopril (PRINIVIL,ZESTRIL) 10 MG tablet   Oral   Take 10 mg by mouth daily.         . metFORMIN (GLUCOPHAGE) 500 MG tablet   Oral   Take 500 mg by mouth 2 (two) times daily with a meal.         . promethazine (PHENERGAN) 25 MG tablet   Oral   Take 1 tablet (25 mg total) by mouth every 6 (six) hours as needed for nausea.   30 tablet   0    BP 128/73  Pulse 84  Temp(Src) 98.9 F (37.2 C) (Oral)  Resp 16  SpO2 98% Physical Exam  Nursing note and vitals reviewed. Constitutional: He is oriented to person, place, and time. He appears well-developed and well-nourished. No distress.  HENT:  Head: Normocephalic and atraumatic.  Mouth/Throat: No trismus in the jaw. Oropharyngeal exudate and posterior oropharyngeal erythema present. No posterior oropharyngeal edema or tonsillar abscesses.  Eyes: Conjunctivae are normal. Pupils are equal, round, and reactive to light. No scleral icterus.  Neck: Neck supple.  Cardiovascular: Normal rate, regular rhythm, normal heart sounds and intact distal pulses.   No murmur heard.  Pulmonary/Chest: Effort normal and breath sounds normal. No stridor. No respiratory distress. He has no wheezes. He has no rales.  Abdominal: Soft. He exhibits no distension. There is no tenderness.  Musculoskeletal: Normal range of motion. He exhibits no edema.  Neurological: He is alert and oriented to person, place, and time.  Skin: Skin is warm and dry. No rash noted.  Psychiatric: He has a normal mood and affect. His behavior is normal.    ED Course   Procedures (including critical care time)  Labs Reviewed  RAPID STREP SCREEN  CULTURE, GROUP A STREP   No results found. 1. Pharyngitis     MDM  Sore throat since yesterday.  No fevers.  Positive cough.  Afebrile, well  appearing.  Lungs clear.  Tonsillar exudate.  No PTA.  Able to swallow, no airway compromise, no trismus.  Rapid strep negative.    Candyce Churn, MD 09/04/12 2016

## 2012-09-04 NOTE — ED Notes (Signed)
Pt in with c/o sore throat

## 2012-09-06 LAB — CULTURE, GROUP A STREP

## 2012-09-07 ENCOUNTER — Telehealth (HOSPITAL_COMMUNITY): Payer: Self-pay | Admitting: Emergency Medicine

## 2012-09-07 NOTE — Progress Notes (Signed)
ED Antimicrobial Stewardship Positive Culture Follow Up   Todd Ayala is an 37 y.o. male who presented to [facility] on 09/04/2012 with a chief complaint of  Chief Complaint  Patient presents with  . Sore Throat    Recent Results (from the past 720 hour(s))  RAPID STREP SCREEN     Status: None   Collection Time    09/04/12  7:40 PM      Result Value Range Status   Streptococcus, Group A Screen (Direct) NEGATIVE  NEGATIVE Final   Comment: (NOTE)     A Rapid Antigen test may result negative if the antigen level in the     sample is below the detection level of this test. The FDA has not     cleared this test as a stand-alone test therefore the rapid antigen     negative result has reflexed to a Group A Strep culture.  CULTURE, GROUP A STREP     Status: None   Collection Time    09/04/12  7:40 PM      Result Value Range Status   Specimen Description THROAT   Final   Special Requests NONE   Final   Culture     Final   Value: STREPTOCOCCUS,BETA HEMOLYIC NOT GROUP A     Performed at [facility]   Report Status 09/06/2012 FINAL   Final    []  Treated with, organism resistant to prescribed antimicrobial [x]  Patient discharged originally without antimicrobial agent and treatment is now indicated  New antibiotic prescription: Penicillin V Potassium 500mg  PO twice daily for 10 days  ED Provider: Rhea Bleacher, PA-C  [name] 09/07/2012, 11:49 AM Infectious Diseases Pharmacist Phone# [phone]

## 2012-09-07 NOTE — ED Notes (Signed)
Post ED Visit - Positive Culture Follow-up: Successful Patient Follow-Up  Culture assessed and recommendations reviewed by: []  Wes Dulaney, Pharm.D., BCPS []  Celedonio Miyamoto, Pharm.D., BCPS []  Georgina Pillion, Pharm.D., BCPS []  [place], [place].D., BCPS, AAHIVP []  Estella Husk, Pharm.D., BCPS, AAHIVP [x]  [name], [address].D.  Positive strep culture  [x]  Patient discharged without antimicrobial prescription and treatment is now indicated []  Organism is resistant to prescribed ED discharge antimicrobial []  Patient with positive blood cultures  Changes discussed with ED provider: Rhea Bleacher PA-C New antibiotic prescription: Penicillin V. Potassium 500 mg PO twice daily for 10 days    Todd Ayala 09/07/2012, 12:42 PM

## 2012-09-10 NOTE — ED Notes (Signed)
Patient notified of + results and requested that rx be called to [facility] [phone]. Rx called by [facility]

## 2013-01-28 ENCOUNTER — Encounter (HOSPITAL_BASED_OUTPATIENT_CLINIC_OR_DEPARTMENT_OTHER): Payer: Self-pay | Admitting: Emergency Medicine

## 2013-01-28 ENCOUNTER — Emergency Department (HOSPITAL_BASED_OUTPATIENT_CLINIC_OR_DEPARTMENT_OTHER)
Admission: EM | Admit: 2013-01-28 | Discharge: 2013-01-28 | Disposition: A | Discharge status: Home / Self Care | Payer: Medicaid Other | Attending: Emergency Medicine | Admitting: Emergency Medicine

## 2013-01-28 ENCOUNTER — Emergency Department (HOSPITAL_BASED_OUTPATIENT_CLINIC_OR_DEPARTMENT_OTHER): Payer: Medicaid Other

## 2013-01-28 DIAGNOSIS — Z7982 Long term (current) use of aspirin: Secondary | ICD-10-CM | POA: Insufficient documentation

## 2013-01-28 DIAGNOSIS — I10 Essential (primary) hypertension: Secondary | ICD-10-CM | POA: Insufficient documentation

## 2013-01-28 DIAGNOSIS — Z87891 Personal history of nicotine dependence: Secondary | ICD-10-CM | POA: Insufficient documentation

## 2013-01-28 DIAGNOSIS — E7800 Pure hypercholesterolemia, unspecified: Secondary | ICD-10-CM | POA: Insufficient documentation

## 2013-01-28 DIAGNOSIS — R071 Chest pain on breathing: Secondary | ICD-10-CM | POA: Insufficient documentation

## 2013-01-28 DIAGNOSIS — Z79899 Other long term (current) drug therapy: Secondary | ICD-10-CM | POA: Insufficient documentation

## 2013-01-28 DIAGNOSIS — R0789 Other chest pain: Secondary | ICD-10-CM

## 2013-01-28 DIAGNOSIS — F411 Generalized anxiety disorder: Secondary | ICD-10-CM | POA: Insufficient documentation

## 2013-01-28 DIAGNOSIS — E119 Type 2 diabetes mellitus without complications: Secondary | ICD-10-CM | POA: Insufficient documentation

## 2013-01-28 LAB — GLUCOSE, CAPILLARY: GLUCOSE-CAPILLARY: 166 mg/dL — AB (ref 70–99)

## 2013-01-28 NOTE — ED Notes (Signed)
Pt reports pain in rib area when taking a deep breath and cough x 1-2 weeks.  He allo awakens with SHOB.

## 2013-01-28 NOTE — Discharge Instructions (Signed)
Return to the ED with any concerns including difficulty breathing, leg swelling, fever/chills, fainting, decreased level of alertness/lethargy, or any other alarming symptoms

## 2013-01-29 NOTE — ED Provider Notes (Signed)
CSN: [id number]     Arrival date & time 01/28/13  1108 History   First MD Initiated Contact with Patient 01/28/13 1318     Chief Complaint  Patient presents with  . Cough  . Shortness of Breath   (Consider location/radiation/quality/duration/timing/severity/associated sxs/prior Treatment) HPI Pt presents with c/o rib pain in anterior lower chest which is worse with palpation, stretching backwards and taking a deep breath.  He also c/o mild cough for the past 1-2 weeks.  Nonproductive.  He also c/o waking in the night with heart racing and feeling short of breath.  He has been told that he has sleep apnea and is wanting to be scheduled for a sleep study through his PMD.  Has appointment next week.  No nausea, no radiation of pain, no diaphoresis.  No fever/chills.  No leg swelling.  No hx of DVT/PE, no hx of recent travel/trauma/surgery.  There are no other associated systemic symptoms, there are no other alleviating or modifying factors.   Past Medical History  Diagnosis Date  . Hypertension   . Diabetes mellitus   . Hypercholesteremia   . Anxiety    History reviewed. No pertinent past surgical history. No family history on file. History  Substance Use Topics  . Smoking status: Former Smoker -- 0.50 packs/day    Types: Cigarettes  . Smokeless tobacco: Not on file  . Alcohol Use: Yes     Comment: occasional    Review of Systems ROS reviewed and all otherwise negative except for mentioned in HPI  Allergies  Review of patient's allergies indicates no known allergies.  Home Medications   Current Outpatient Rx  Name  Route  Sig  Dispense  Refill  . acetaminophen-codeine (TYLENOL #3) 300-30 MG per tablet   Oral   Take 1-2 tablets by mouth every 6 (six) hours as needed for pain.   15 tablet   0   . ALPRAZolam (XANAX) 0.5 MG tablet   Oral   Take 0.5 mg by mouth at bedtime as needed for sleep.         Marland Kitchen aspirin 325 MG EC tablet   Oral   Take 325 mg by mouth daily.          . benzonatate (TESSALON) 100 MG capsule   Oral   Take 1 capsule (100 mg total) by mouth every 8 (eight) hours.   10 capsule   0   . glipiZIDE (GLUCOTROL) 5 MG tablet   Oral   Take 5 mg by mouth 2 (two) times daily before a meal.         . lisinopril (PRINIVIL,ZESTRIL) 10 MG tablet   Oral   Take 10 mg by mouth daily.         . metFORMIN (GLUCOPHAGE) 500 MG tablet   Oral   Take 500 mg by mouth 2 (two) times daily with a meal.         . promethazine (PHENERGAN) 25 MG tablet   Oral   Take 1 tablet (25 mg total) by mouth every 6 (six) hours as needed for nausea.   30 tablet   0    BP 142/73  Pulse 83  Temp(Src) 99 F (37.2 C) (Oral)  Resp 20  Ht 6\' 6"  (1.981 m)  Wt 265 lb (120.203 kg)  BMI 30.63 kg/m2  SpO2 100% Vitals reviewed Physical Exam Physical Examination: General appearance - alert, well appearing, and in no distress Mental status - alert, oriented to person, place, and time  Eyes - no conjunctival injection, no scleral icterus Mouth - mucous membranes moist, pharynx normal without lesions Chest - clear to auscultation, no wheezes, rales or rhonchi, symmetric air entry, ttp over lower bilateral ribs in anterior chest wall Heart - normal rate, regular rhythm, normal S1, S2, no murmurs, rubs, clicks or gallops Abdomen - soft, nontender, nondistended, no masses or organomegaly Extremities - peripheral pulses normal, no pedal edema, no clubbing or cyanosis Skin - normal coloration and turgor, no rashes  ED Course  Procedures (including critical care time) Labs Review Labs Reviewed  GLUCOSE, CAPILLARY - Abnormal; Notable for the following:    Glucose-Capillary 166 (*)    All other components within normal limits   Imaging Review Dg Chest 2 View  01/28/2013   CLINICAL DATA:  Cough, shortness of breath, chest pain  EXAM: CHEST  2 VIEW  COMPARISON:  01/03/2012  FINDINGS: The heart size and mediastinal contours are within normal limits. Both lungs are clear.  The visualized skeletal structures are unremarkable.  IMPRESSION: No active cardiopulmonary disease.   Electronically Signed   By: Ruel Favorsrevor  Shick M.D.   On: 01/28/2013 12:01    EKG Interpretation    Date/Time:  Wednesday January 28 2013 14:33:20 EST Ventricular Rate:  63 PR Interval:  136 QRS Duration: 88 QT Interval:  392 QTC Calculation: 401 R Axis:   67 Text Interpretation:  Normal sinus rhythm Early repolarization Normal ECG No significant change since last tracing Confirmed by Karma GanjaLINKER  MD, Karra Pink [phone] on 01/28/2013 2:38:16 PM            MDM   1. Chest wall pain    Pt presenting with c/o chest wall pain, worse with palpation and stretching backward, CXR and EKG are reassuring. PERC 0 so very low risk for PE. Recommended ibuprofen for pain.  Pt also has symptoms c/w sleep apnea, he has appointment next week and plans to schedule a sleep study.  Discharged with strict return precautions.  Pt agreeable with plan.    Ethelda ChickMartha K Linker, MD 01/29/13 [phone]

## 2013-05-16 ENCOUNTER — Emergency Department (HOSPITAL_BASED_OUTPATIENT_CLINIC_OR_DEPARTMENT_OTHER)
Admission: EM | Admit: 2013-05-16 | Discharge: 2013-05-16 | Disposition: A | Discharge status: Home / Self Care | Payer: Medicaid Other | Attending: Emergency Medicine | Admitting: Emergency Medicine

## 2013-05-16 ENCOUNTER — Encounter (HOSPITAL_BASED_OUTPATIENT_CLINIC_OR_DEPARTMENT_OTHER): Payer: Self-pay | Admitting: Emergency Medicine

## 2013-05-16 DIAGNOSIS — E7800 Pure hypercholesterolemia, unspecified: Secondary | ICD-10-CM | POA: Insufficient documentation

## 2013-05-16 DIAGNOSIS — F17200 Nicotine dependence, unspecified, uncomplicated: Secondary | ICD-10-CM | POA: Insufficient documentation

## 2013-05-16 DIAGNOSIS — F411 Generalized anxiety disorder: Secondary | ICD-10-CM | POA: Insufficient documentation

## 2013-05-16 DIAGNOSIS — J029 Acute pharyngitis, unspecified: Secondary | ICD-10-CM | POA: Insufficient documentation

## 2013-05-16 DIAGNOSIS — Z7982 Long term (current) use of aspirin: Secondary | ICD-10-CM | POA: Insufficient documentation

## 2013-05-16 DIAGNOSIS — I10 Essential (primary) hypertension: Secondary | ICD-10-CM | POA: Insufficient documentation

## 2013-05-16 DIAGNOSIS — Z760 Encounter for issue of repeat prescription: Secondary | ICD-10-CM | POA: Insufficient documentation

## 2013-05-16 DIAGNOSIS — Z79899 Other long term (current) drug therapy: Secondary | ICD-10-CM | POA: Insufficient documentation

## 2013-05-16 DIAGNOSIS — E119 Type 2 diabetes mellitus without complications: Secondary | ICD-10-CM | POA: Insufficient documentation

## 2013-05-16 LAB — CBG MONITORING, ED: Glucose-Capillary: 149 mg/dL — ABNORMAL HIGH (ref 70–99)

## 2013-05-16 LAB — RAPID STREP SCREEN (MED CTR MEBANE ONLY): STREPTOCOCCUS, GROUP A SCREEN (DIRECT): NEGATIVE

## 2013-05-16 MED ORDER — METFORMIN HCL 500 MG PO TABS: 500.0000 mg | ORAL_TABLET | Freq: Two times a day (BID) | ORAL | Status: AC

## 2013-05-16 MED ORDER — GLIPIZIDE 5 MG PO TABS: 5.0000 mg | ORAL_TABLET | Freq: Two times a day (BID) | ORAL | Status: AC

## 2013-05-16 MED ORDER — FEXOFENADINE HCL 60 MG PO TABS: 60.0000 mg | ORAL_TABLET | Freq: Two times a day (BID) | ORAL | Status: DC

## 2013-05-16 MED ORDER — LISINOPRIL 10 MG PO TABS: 10.0000 mg | ORAL_TABLET | Freq: Every day | ORAL | Status: DC

## 2013-05-16 MED ORDER — ALPRAZOLAM 0.5 MG PO TABS: 0.5000 mg | ORAL_TABLET | Freq: Every evening | ORAL | Status: DC | PRN

## 2013-05-16 NOTE — ED Provider Notes (Signed)
CSN: [id number]     Arrival date & time 05/16/13  1933 History  This chart was scribed for Rolan BuccoMelanie Yusef Lamp, MD by Beverly MilchJ Harrison Collins, ED Scribe. This patient was seen in room MH10/MH10 and the patient's care was started at 8:52 PM.    Chief Complaint  Patient presents with  . Sore Throat      HPI HPI Comments: Todd Ayala is a 38 y.o. male who presents to the Emergency Department complaining of sore throat that began 3 days ago. He reports associated cough, congestion, generalized myalgias, fever, and chills. Pt states it began with head congestion and his sore throat began to gradually worsen. He states his throat is swollen and hurts him to swallow. He states he has had a lump of tissue in his throat for years that he has been told from other providers that needs to be taken out. He states it's been enlarging. Pt reports taking Theraflu to treat his symptoms and that it did help with the fever and myalgias, but his sore throat persists. He denies nausea, vomiting, diarrhea, rashes, and dysuria. Pt reports he needs med refills for his lisinopril, metformin, and alprazolam.  Past Medical History  Diagnosis Date  . Hypertension   . Diabetes mellitus   . Hypercholesteremia   . Anxiety    Past Surgical History  Procedure Laterality Date  . Tibia fracture surgery     No family history on file. History  Substance Use Topics  . Smoking status: Current Every Day Smoker -- 0.50 packs/day    Types: Cigarettes  . Smokeless tobacco: Not on file  . Alcohol Use: Yes     Comment: occasional    Review of Systems  Constitutional: Positive for fever and chills. Negative for diaphoresis and fatigue.  HENT: Positive for congestion, sore throat and trouble swallowing. Negative for rhinorrhea and sneezing.   Eyes: Negative.   Respiratory: Positive for cough. Negative for chest tightness and shortness of breath.   Cardiovascular: Negative for chest pain and leg swelling.  Gastrointestinal:  Negative for nausea, vomiting, abdominal pain, diarrhea and blood in stool.  Genitourinary: Negative for frequency, hematuria, flank pain and difficulty urinating.  Musculoskeletal: Positive for myalgias (generalized). Negative for arthralgias and back pain.  Skin: Negative for rash.  Neurological: Negative for dizziness, speech difficulty, weakness, numbness and headaches.      Allergies  Review of patient's allergies indicates no known allergies.  Home Medications   Prior to Admission medications   Medication Sig Start Date End Date Taking? Authorizing Provider  acetaminophen-codeine (TYLENOL #3) 300-30 MG per tablet Take 1-2 tablets by mouth every 6 (six) hours as needed for pain. 08/06/12   Derwood KaplanAnkit Nanavati, MD  ALPRAZolam Prudy Feeler(XANAX) 0.5 MG tablet Take 0.5 mg by mouth at bedtime as needed for sleep.    Historical Provider, MD  aspirin 325 MG EC tablet Take 325 mg by mouth daily.    Historical Provider, MD  benzonatate (TESSALON) 100 MG capsule Take 1 capsule (100 mg total) by mouth every 8 (eight) hours. 09/04/12   Candyce ChurnJohn David Wofford III, MD  glipiZIDE (GLUCOTROL) 5 MG tablet Take 5 mg by mouth 2 (two) times daily before a meal.    Historical Provider, MD  lisinopril (PRINIVIL,ZESTRIL) 10 MG tablet Take 10 mg by mouth daily.    Historical Provider, MD  metFORMIN (GLUCOPHAGE) 500 MG tablet Take 500 mg by mouth 2 (two) times daily with a meal.    Historical Provider, MD  promethazine (PHENERGAN) 25 MG  tablet Take 1 tablet (25 mg total) by mouth every 6 (six) hours as needed for nausea. 08/06/12   Derwood KaplanAnkit Nanavati, MD   Triage Vitals: BP 124/86  Pulse 70  Temp(Src) 98.4 F (36.9 C) (Oral)  Resp 20  Ht 6\' 6"  (1.981 m)  Wt 252 lb (114.306 kg)  BMI 29.13 kg/m2  SpO2 99%  Physical Exam  Nursing note and vitals reviewed. Constitutional: He is oriented to person, place, and time. He appears well-developed and well-nourished.  HENT:  Head: Normocephalic and atraumatic.  Mouth/Throat: Uvula is  midline. No trismus in the jaw. No oropharyngeal exudate or posterior oropharyngeal erythema.  Wart like projection / polyp to the right tonsil  Eyes: Pupils are equal, round, and reactive to light.  Neck: Normal range of motion. Neck supple.  Cardiovascular: Normal rate, regular rhythm and normal heart sounds.   Pulmonary/Chest: Effort normal and breath sounds normal. No respiratory distress. He has no wheezes. He has no rales. He exhibits no tenderness.  Abdominal: Soft. Bowel sounds are normal. There is no tenderness. There is no rebound and no guarding.  Musculoskeletal: Normal range of motion. He exhibits no edema.  Lymphadenopathy:    He has no cervical adenopathy.  Neurological: He is alert and oriented to person, place, and time.  Skin: Skin is warm and dry. No rash noted.  Psychiatric: He has a normal mood and affect.    ED Course  Procedures (including critical care time)  DIAGNOSTIC STUDIES: Oxygen Saturation is 99% on RA, normal by my interpretation.    COORDINATION OF CARE: 9:00 PM- Pt advised of plan for treatment and pt agrees.    Labs Review Labs Reviewed  CBG MONITORING, ED - Abnormal; Notable for the following:    Glucose-Capillary 149 (*)    All other components within normal limits  RAPID STREP SCREEN  CULTURE, GROUP A STREP    Imaging Review No results found.   EKG Interpretation None      MDM   Final diagnoses:  Pharyngitis  Medication refill    Pt is well appearing.  No airway issues or swelling noted.  Strept neg, likely viral.  Given rx for allegra as well as refills on his medications.  I gave him resources for outpatient referral.  He has a wartlike projection on his tonsil that he has had for years.  I encouraged him to f/u with an ENT it is enlarging.    I personally performed the services described in this documentation, which was scribed in my presence.  The recorded information has been reviewed and considered.     Rolan BuccoMelanie  Dagen Beevers, MD 05/16/13 2200

## 2013-05-16 NOTE — ED Notes (Signed)
Sore throat x 3 days- recent URI

## 2013-05-16 NOTE — Discharge Instructions (Signed)
Pharyngitis °Pharyngitis is redness, pain, and swelling (inflammation) of your pharynx.  °CAUSES  °Pharyngitis is usually caused by infection. Most of the time, these infections are from viruses (viral) and are part of a cold. However, sometimes pharyngitis is caused by bacteria (bacterial). Pharyngitis can also be caused by allergies. Viral pharyngitis may be spread from person to person by coughing, sneezing, and personal items or utensils (cups, forks, spoons, toothbrushes). Bacterial pharyngitis may be spread from person to person by more intimate contact, such as kissing.  °SIGNS AND SYMPTOMS  °Symptoms of pharyngitis include:   °· Sore throat.   °· Tiredness (fatigue).   °· Low-grade fever.   °· Headache. °· Joint pain and muscle aches. °· Skin rashes. °· Swollen lymph nodes. °· Plaque-like film on throat or tonsils (often seen with bacterial pharyngitis). °DIAGNOSIS  °Your health care provider will ask you questions about your illness and your symptoms. Your medical history, along with a physical exam, is often all that is needed to diagnose pharyngitis. Sometimes, a rapid strep test is done. Other lab tests may also be done, depending on the suspected cause.  °TREATMENT  °Viral pharyngitis will usually get better in 3 4 days without the use of medicine. Bacterial pharyngitis is treated with medicines that kill germs (antibiotics).  °HOME CARE INSTRUCTIONS  °· Drink enough water and fluids to keep your urine clear or pale yellow.   °· Only take over-the-counter or prescription medicines as directed by your health care provider:   °· If you are prescribed antibiotics, make sure you finish them even if you start to feel better.   °· Do not take aspirin.   °· Get lots of rest.   °· Gargle with 8 oz of salt water (½ tsp of salt per 1 qt of water) as often as every 1 2 hours to soothe your throat.   °· Throat lozenges (if you are not at risk for choking) or sprays may be used to soothe your throat. °SEEK MEDICAL  CARE IF:  °· You have large, tender lumps in your neck. °· You have a rash. °· You cough up green, yellow-brown, or bloody spit. °SEEK IMMEDIATE MEDICAL CARE IF:  °· Your neck becomes stiff. °· You drool or are unable to swallow liquids. °· You vomit or are unable to keep medicines or liquids down. °· You have severe pain that does not go away with the use of recommended medicines. °· You have trouble breathing (not caused by a stuffy nose). °MAKE SURE YOU:  °· Understand these instructions. °· Will watch your condition. °· Will get help right away if you are not doing well or get worse. °Document Released: 01/08/2005 Document Revised: 10/29/2012 Document Reviewed: 09/15/2012 °ExitCare® Patient Information ©2014 [employer]. ° ° °Emergency Department Resource Guide °1) Find a Doctor and Pay Out of Pocket °Although you won't have to find out who is covered by your insurance plan, it is a good idea to ask around and get recommendations. You will then need to call the office and see if the doctor you have chosen will accept you as a new patient and what types of options they offer for patients who are self-pay. Some doctors offer discounts or will set up payment plans for their patients who do not have insurance, but you will need to ask so you aren't surprised when you get to your appointment. ° °2) Contact Your Local Health Department °Not all health departments have doctors that can see patients for sick visits, but many do, so it is worth a   call to see if yours does. If you don't know where your local health department is, you can check in your phone book. The CDC also has a tool to help you locate your state's health department, and many state websites also have listings of all of their local health departments.  3) Find a Walk-in Clinic If your illness is not likely to be very severe or complicated, you may want to try a walk in clinic. These are popping up all over the country in pharmacies, drugstores, and  shopping centers. They're usually staffed by nurse practitioners or physician assistants that have been trained to treat common illnesses and complaints. They're usually fairly quick and inexpensive. However, if you have serious medical issues or chronic medical problems, these are probably not your best option.  No Primary Care Doctor: - Call Health Connect at  [phone] - they can help you locate a primary care doctor that  accepts your insurance, provides certain services, etc. - Physician Referral Service- [phone]  Chronic Pain Problems: Organization         Address  Phone   Notes  [facility]  [phone]) [phone] Patients need to be referred by their primary care doctor.   Medication Assistance: Organization         Address  Phone   Notes  [facility] [address] [place], [place] [address] [phone]) [phone] --Must be a resident of [place] -- Must have NO insurance coverage whatsoever (no Medicaid/ Medicare, etc.) -- The pt. MUST have a primary care doctor that directs their care regularly and follows them in the community   [facility]ist  [phone]   [employer]  [phone]    Agencies that provide inexpensive medical care: Organization         Address  Phone   Notes  [facility]  [phone]   [facility]    [phone]   [facility] [address] [place], [place] [address] [phone]   [facility] [address], [place] [phone]   [facility]    [phone]   [facility]    [phone]   [facility]  [address], [place] Phone:  [phone](336) [phone], Fax:  [phone] Hours of Operation:  9 am - 6 pm, M-F.  Also accepts Medicaid/Medicare and self-pay.  [facility]  [address], [place] Phone: [phone]) [phone],  Fax: [phone]. Hours of Operation:  8:30 am - 5:30 pm, M-F.  Also accepts Medicaid and self-pay.  [facility] [place] [address], [place] Phone: [phone]   [facility] [address], [place], [place] [phone], Ext. [phone] Mondays & Thursdays: 7-9 AM.  First 15 patients are seen on a first come, first serve basis.    Medicaid-accepting [facility] Providers:  Organization         Address  Phone   Notes  [facility] [address], [place] [phone]) [phone] Also accepts self-pay patients.  [facility] [address], [place]  [phone]) [phone]   [facility] [address], [place] [phone]   [facility] [address] [place] [address], [place] [phone](336) [phone]   Renaye RakersVeita Bland [address], [address],  [place]  ° [phone] Only accepts [place] Access Medicaid patients after they have their name applied to their card.  ° °Self-Pay (no insurance) in [place]: ° °Organization         Address  Phone   Notes  °Sickle Cell Patients, [facility] [address], [place] [phone]   °[facility] [address], [place] [phone]   °[facility] [place] ° [address], [place] [phone]   °[facility]/Dr. Osei-Bonsu ° [address] [place] [address], [place] or [address], [place] [phone] Phone number for both [place] and [place] locations is the same.  °[facility] [address], [place] [phone]   °[facility] [address] [place] [address], [place] or [address] [phone]) [phone] °([phone]) [phone]   °[facility] [address], [place] [phone]) [phone], phone; [phone], fax Sees patients 1st and 3rd Saturday of every month.  Must not qualify for public or private  insurance (i.e. Medicaid, Medicare, [place] Health Choice, Veterans' Benefits) • Household income should be no more than 200% of the poverty level •The clinic cannot treat you if you are pregnant or think you are pregnant • Sexually transmitted diseases are not treated at the clinic.  ° ° °Dental Care: °Organization         Address  Phone  Notes  °[facility] [address], [place] [phone]) [phone] Accepts children up to age 21 who are enrolled in Medicaid or [place] Health Choice; pregnant women with a Medicaid card; and children who have applied for Medicaid or [place] Health Choice, but were declined, whose parents can pay a reduced fee at time of service.  °[facility] [place]  [address], [place] [phone]) [phone] Accepts children up to age 21 who are enrolled in Medicaid or [place] Health Choice; pregnant women with a Medicaid card; and children who have applied for Medicaid or [place] Health Choice, but were declined, whose parents can pay a reduced fee at time of service.  °[facility] ° [address], [place] [phone]) [phone] Patients are seen by appointment only. Walk-ins are not accepted. [facility] will see patients 18 years of age and older. °Monday - Tuesday (8am-5pm) °Most Wednesdays (8:30-5pm) °$30 per visit, cash only  °[facility] ° [address], [place] [phone]) [phone] Patients are seen by appointment only. Walk-ins are not accepted. [facility] will see patients 18 years of age and older. °One Wednesday Evening (Monthly: Volunteer Based).  $30 per visit, cash only  °[facility]  [phone] for adults; Children under age 4, call Graduate Pediatric Dentistry at [phone]. Children aged 4-14, please call [phone] to request a pediatric application. ° Dental services are provided in all areas of dental care  including fillings, crowns and bridges, complete and partial dentures, implants, gum treatment, root canals, and extractions. Preventive care is also provided. Treatment is provided to both adults and children. °Patients are selected via a lottery and there is often a waiting list. °  °[facility] [address], °[place] ° ([phone]) [phone] [email or web address] °  °[facility] [address], [place], [place] [phone], Ext. [phone] Second and Fourth Thursday of each month,   opens at 6:30 AM; Clinic ends at 9 AM.  Patients are seen on a first-come first-served basis, and a limited number are seen during each clinic.  ° °[facility] ° [address], [place], [place] [phone]) [phone]   Eligibility Requirements °You must have lived in [place], [place], or [place] counties for at least the last three months. °  You cannot be eligible for state or federal sponsored healthcare insurance, including Veterans Administration, Medicaid, or Medicare. °  You generally cannot be eligible for healthcare insurance through your employer.  °  How to apply: °Eligibility screenings are held every Tuesday and Wednesday afternoon from 1:00 pm until 4:00 pm. You do not need an appointment for the interview!  °[facility] [address], [place], [place] [phone]   °[facility]  [phone]   °[facility]  336-703-3100   °[facility]  336-570-6415   ° °Behavioral Health Resources in the Community: °Intensive Outpatient Programs °Organization         Address  Phone  Notes  °[facility] [place] [facility] [address], [place], [place] [phone]   °[facility] [address], [place], [place] [phone]   °[facility] [address], [place], [place] ° 336-882-2125   °[facility] [address],  °[place], [place] 1-800-853-5163 or 336-641-4981     °Substance Abuse Resources °Organization         Address  Phone  Notes  °[facility]  336-882-2125   °[facility]  336-784-9470   °[facility]  336-285-9073   °[facility]  336-845-3988   °Residential & Outpatient Substance Abuse Program  1-800-659-3381   °Psychological Services °Organization         Address  Phone  Notes  °[facility]  336- 832-9600   °[facility]  336- 378-7881   °[facility] [address], [place] 1-800-853-5163 or 336-641-4981   ° °Mobile Crisis Teams °Organization         Address  Phone  Notes  °[facility], Mobile Crisis Care Unit  1-877-626-1772   °Assertive °[facility] ° [address] [place], [place] 336-834-9664   °Sharon DeEsch [address] °[place] 336-554-5454   ° °Self-Help/Support Groups °Organization         Address  Phone             Notes  °[facility] [employer] - variety of support groups  336- 373-1402 Call for more information  °Narcotics Anonymous (NA), [facility] [address], °[place]  2 meetings at this location  ° °Residential Treatment Programs °Organization         Address  Phone  Notes  °[facility] [address],    °[place]  1-866-801-8205   °[facility] ° [address], [place], [place] 704-293-8524   °[facility] [address], [place] 336-845-3988 Admissions: 8am-3pm M-F  °[facility] [address]    °[place], [place] 336-841-1104   °[facility] [address], [place], [place] 336-379-7146   °[facility] [address]  °[place], [place] 336-285-9073   °[facility] - Intensive Outpatient [address], , [place] 336-852-3033   °[facility] [address]  °[place], [place]   or (312) [phone]   [facility] ([facility]) [address] [place], [place] [phone] Accepts Medicaid  [facility] [address]  [place] [phone] Substance Abuse/Addiction Treatment   [facility] Resources Organization         Address  Phone  Notes  [facility]  786-068-3222(888) [phone]   Angie FavaJulie Brannon, PhD [address], [address] [place], [place]   [phone]) [phone] or [phone](336) [phone]   [facility]   [address] [place], [place] [phone](336) [phone]   [facility] [address], [place], [place] [phone](336) [phone] Insurance/Medicaid/sponsorship through [facility]  [facility] [address]                                    [place], [place] [phone]) [phone] Therapy/tele-psych/case  [facility] [address]   [place], [place] [phone](336) [phone]    Dr. Lolly MustacheArfeen  [phone]) [phone]   [facility]  [employer] [facility] 1) [address], [place] 2) [address], [place] 3)  [address], [place] [phone](336) [phone](336) [phone]  [phone](336) [phone]   [facility] Child Abuse Hotline [phone](336) [phone] or [phone](336) [phone] (After Hours)

## 2013-05-18 LAB — CULTURE, GROUP A STREP

## 2013-10-08 ENCOUNTER — Encounter (HOSPITAL_BASED_OUTPATIENT_CLINIC_OR_DEPARTMENT_OTHER): Payer: Self-pay | Admitting: Emergency Medicine

## 2013-10-08 ENCOUNTER — Emergency Department (HOSPITAL_BASED_OUTPATIENT_CLINIC_OR_DEPARTMENT_OTHER)
Admission: EM | Admit: 2013-10-08 | Discharge: 2013-10-08 | Disposition: A | Discharge status: Home / Self Care | Payer: Medicaid Other | Attending: Emergency Medicine | Admitting: Emergency Medicine

## 2013-10-08 DIAGNOSIS — F17200 Nicotine dependence, unspecified, uncomplicated: Secondary | ICD-10-CM | POA: Diagnosis not present

## 2013-10-08 DIAGNOSIS — E119 Type 2 diabetes mellitus without complications: Secondary | ICD-10-CM | POA: Insufficient documentation

## 2013-10-08 DIAGNOSIS — Z760 Encounter for issue of repeat prescription: Secondary | ICD-10-CM | POA: Insufficient documentation

## 2013-10-08 DIAGNOSIS — Z79899 Other long term (current) drug therapy: Secondary | ICD-10-CM | POA: Diagnosis not present

## 2013-10-08 DIAGNOSIS — Z7982 Long term (current) use of aspirin: Secondary | ICD-10-CM | POA: Insufficient documentation

## 2013-10-08 DIAGNOSIS — G589 Mononeuropathy, unspecified: Secondary | ICD-10-CM | POA: Diagnosis not present

## 2013-10-08 DIAGNOSIS — Z862 Personal history of diseases of the blood and blood-forming organs and certain disorders involving the immune mechanism: Secondary | ICD-10-CM | POA: Diagnosis not present

## 2013-10-08 DIAGNOSIS — I10 Essential (primary) hypertension: Secondary | ICD-10-CM | POA: Insufficient documentation

## 2013-10-08 DIAGNOSIS — Z8639 Personal history of other endocrine, nutritional and metabolic disease: Secondary | ICD-10-CM | POA: Insufficient documentation

## 2013-10-08 DIAGNOSIS — F411 Generalized anxiety disorder: Secondary | ICD-10-CM | POA: Insufficient documentation

## 2013-10-08 DIAGNOSIS — G629 Polyneuropathy, unspecified: Secondary | ICD-10-CM

## 2013-10-08 MED ORDER — GABAPENTIN 300 MG PO CAPS: 300.0000 mg | ORAL_CAPSULE | Freq: Three times a day (TID) | ORAL | Status: AC

## 2013-10-08 MED ORDER — LISINOPRIL 20 MG PO TABS: 20.0000 mg | ORAL_TABLET | Freq: Every day | ORAL | Status: DC

## 2013-10-08 MED ORDER — ALPRAZOLAM 1 MG PO TABS: 1.0000 mg | ORAL_TABLET | Freq: Every evening | ORAL | Status: AC | PRN

## 2013-10-08 NOTE — ED Provider Notes (Signed)
CSN: [id number]     Arrival date & time 10/08/13  1452 History   First MD Initiated Contact with Patient 10/08/13 1510     Chief Complaint  Patient presents with  . Medication Refill     (Consider location/radiation/quality/duration/timing/severity/associated sxs/prior Treatment) HPI Comments: Pt is out of lisinopril, gabapentin and xanax.  Pt reports he owes his MD money.  No current complaints. Pt reports he feels shakey without xanax  The history is provided by the patient. No language interpreter was used.    Past Medical History  Diagnosis Date  . Hypertension   . Diabetes mellitus   . Hypercholesteremia   . Anxiety    Past Surgical History  Procedure Laterality Date  . Tibia fracture surgery     No family history on file. History  Substance Use Topics  . Smoking status: Current Every Day Smoker -- 0.50 packs/day    Types: Cigarettes  . Smokeless tobacco: Not on file  . Alcohol Use: Yes     Comment: occasional    Review of Systems  All other systems reviewed and are negative.     Allergies  Review of patient's allergies indicates no known allergies.  Home Medications   Prior to Admission medications   Medication Sig Start Date End Date Taking? Authorizing Provider  acetaminophen-codeine (TYLENOL #3) 300-30 MG per tablet Take 1-2 tablets by mouth every 6 (six) hours as needed for pain. 08/06/12   Derwood Kaplan, MD  ALPRAZolam Prudy Feeler) 0.5 MG tablet Take 1 tablet (0.5 mg total) by mouth at bedtime as needed for sleep. 05/16/13   Rolan Bucco, MD  ALPRAZolam Prudy Feeler) 1 MG tablet Take 1 tablet (1 mg total) by mouth at bedtime as needed for anxiety. 10/08/13   Elson Areas, PA-C  aspirin 325 MG EC tablet Take 325 mg by mouth daily.    Historical Provider, MD  benzonatate (TESSALON) 100 MG capsule Take 1 capsule (100 mg total) by mouth every 8 (eight) hours. 09/04/12   Candyce Churn III, MD  fexofenadine (ALLEGRA) 60 MG tablet Take 1 tablet (60 mg total) by  mouth 2 (two) times daily. 05/16/13   Rolan Bucco, MD  gabapentin (NEURONTIN) 300 MG capsule Take 1 capsule (300 mg total) by mouth 3 (three) times daily. 10/08/13   Elson Areas, PA-C  glipiZIDE (GLUCOTROL) 5 MG tablet Take 1 tablet (5 mg total) by mouth 2 (two) times daily before a meal. 05/16/13   Rolan Bucco, MD  lisinopril (PRINIVIL,ZESTRIL) 10 MG tablet Take 1 tablet (10 mg total) by mouth daily. 05/16/13   Rolan Bucco, MD  lisinopril (PRINIVIL,ZESTRIL) 20 MG tablet Take 1 tablet (20 mg total) by mouth daily. 10/08/13   Elson Areas, PA-C  metFORMIN (GLUCOPHAGE) 500 MG tablet Take 1 tablet (500 mg total) by mouth 2 (two) times daily with a meal. 05/16/13   Rolan Bucco, MD  promethazine (PHENERGAN) 25 MG tablet Take 1 tablet (25 mg total) by mouth every 6 (six) hours as needed for nausea. 08/06/12   Ankit Rhunette Croft, MD   BP 133/76  Pulse 96  Temp(Src) 97.6 F (36.4 C) (Oral)  Resp 18  Ht  (1.981 m)  Wt 249 lb (112.946 kg)  BMI 28.78 kg/m2  SpO2 98% Physical Exam  Nursing note and vitals reviewed. Constitutional: He is oriented to person, place, and time. He appears well-developed and well-nourished.  HENT:  Head: Normocephalic.  Eyes: EOM are normal.  Neck: Normal range of motion.  Cardiovascular: Normal  rate and normal heart sounds.   Pulmonary/Chest: Effort normal and breath sounds normal.  Abdominal: He exhibits no distension.  Musculoskeletal: Normal range of motion.  Neurological: He is alert and oriented to person, place, and time.  Psychiatric: He has a normal mood and affect.    ED Course  Procedures (including critical care time) Labs Review Labs Reviewed - No data to display  Imaging Review No results found.   EKG Interpretation None      MDM   Final diagnoses:  Neuropathy        Elson Areas, PA-C 10/08/13 1559

## 2013-10-08 NOTE — Discharge Instructions (Signed)
Diabetic Neuropathy Diabetic neuropathy is a nerve disease or nerve damage that is caused by diabetes mellitus. About half of all people with diabetes mellitus have some form of nerve damage. Nerve damage is more common in those who have had diabetes mellitus for many years and who generally have not had good control of their blood sugar (glucose) level. Diabetic neuropathy is a common complication of diabetes mellitus. There are three more common types of diabetic neuropathy and a fourth type that is less common and less understood:   Peripheral neuropathy--This is the most common type of diabetic neuropathy. It causes damage to the nerves of the feet and legs first and then eventually the hands and arms.The damage affects the ability to sense touch.  Autonomic neuropathy--This type causes damage to the autonomic nervous system, which controls the following functions:  Heartbeat.  Body temperature.  Blood pressure.  Urination.  Digestion.  Sweating.  Sexual function.  Focal neuropathy--Focal neuropathy can be painful and unpredictable and occurs most often in older adults with diabetes mellitus. It involves a specific nerve or one area and often comes on suddenly. It usually does not cause long-term problems.  Radiculoplexus neuropathy-- Sometimes called lumbosacral radiculoplexus neuropathy, radiculoplexus neuropathy affects the nerves of the thighs, hips, buttocks, or legs. It is more common in people with type 2 diabetes mellitus and in older men. It is characterized by debilitating pain, weakness, and atrophy, usually in the thigh muscles. CAUSES  The cause of peripheral, autonomic, and focal neuropathies is diabetes mellitus that is uncontrolled and high glucose levels. The cause of radiculoplexus neuropathy is unknown. However, it is thought to be caused by inflammation related to uncontrolled glucose levels. SIGNS AND SYMPTOMS  Peripheral Neuropathy Peripheral neuropathy develops  slowly over time. When the nerves of the feet and legs no longer work there may be:   Burning, stabbing, or aching pain in the legs or feet.  Inability to feel pressure or pain in your feet. This can lead to:  Thick calluses over pressure areas.  Pressure sores.  Ulcers.  Foot deformities.  Reduced ability to feel temperature changes.  Muscle weakness. Autonomic Neuropathy The symptoms of autonomic neuropathy vary depending on which nerves are affected. Symptoms may include:  Problems with digestion, such as:  Feeling sick to your stomach (nausea).  Vomiting.  Bloating.  Constipation.  Diarrhea.  Abdominal pain.  Difficulty with urination. This occurs if you lose your ability to sense when your bladder is full. Problems include:  Urine leakage (incontinence).  Inability to empty your bladder completely (retention).  Rapid or irregular heartbeat (palpitations).  Blood pressure drops when you stand up (orthostatic hypotension). When you stand up you may feel:  Dizzy.  Weak.  Faint.  In men, inability to attain and maintain an erection.  In women, vaginal dryness and problems with decreased sexual desire and arousal.  Problems with body temperature regulation.  Increased or decreased sweating. Focal Neuropathy  Abnormal eye movements or abnormal alignment of both eyes.  Weakness in the wrist.  Foot drop. This results in an inability to lift the foot properly and abnormal walking or foot movement.  Paralysis on one side of your face (Bell palsy).  Chest or abdominal pain. Radiculoplexus Neuropathy  Sudden, severe pain in your hip, thigh, or buttocks.  Weakness and wasting of thigh muscles.  Difficulty rising from a seated position.  Abdominal swelling.  Unexplained weight loss (usually more than 10 lb [4.5 kg]). DIAGNOSIS  Peripheral Neuropathy Your senses may   be tested. Sensory function testing can be done with:  A light touch using a  monofilament.  A vibration with tuning fork.  A sharp sensation with a pin prick. Other tests that can help diagnose neuropathy are:  Nerve conduction velocity. This test checks the transmission of an electrical current through a nerve.  Electromyography. This shows how muscles respond to electrical signals transmitted by nearby nerves.  Quantitative sensory testing. This is used to assess how your nerves respond to vibrations and changes in temperature. Autonomic Neuropathy Diagnosis is often based on reported symptoms. Tell your health care provider if you experience:   Dizziness.   Constipation.   Diarrhea.   Inappropriate urination or inability to urinate.   Inability to get or maintain an erection.  Tests that may be done include:   Electrocardiography or Holter monitor. These are tests that can help show problems with the heart rate or heart rhythm.   An X-ray exam may be done. Focal Neuropathy Diagnosis is made based on your symptoms and what your health care provider finds during your exam. Other tests may be done. They may include:  Nerve conduction velocities. This checks the transmission of electrical current through a nerve.  Electromyography. This shows how muscles respond to electrical signals transmitted by nearby nerves.  Quantitative sensory testing. This test is used to assess how your nerves respond to vibration and changes in temperature. Radiculoplexus Neuropathy  Often the first thing is to eliminate any other issue or problems that might be the cause, as there is no stick test for diagnosis.  X-ray exam of your spine and lumbar region.  Spinal tap to rule out cancer.  MRI to rule out other lesions. TREATMENT  Once nerve damage occurs, it cannot be reversed. The goal of treatment is to keep the disease or nerve damage from getting worse and affecting more nerve fibers. Controlling your blood glucose level is the key. Most people with  radiculoplexus neuropathy see at least a partial improvement over time. You will need to keep your blood glucose and HbA1c levels in the target range determined by your health care provider. Things that help control blood glucose levels include:   Blood glucose monitoring.   Meal planning.   Physical activity.   Diabetes medicine.  Over time, maintaining lower blood glucose levels helps lessen symptoms. Sometimes, prescription pain medicine is needed. HOME CARE INSTRUCTIONS:  Do not smoke.  Keep your blood glucose level in the range that you and your health care provider have determined acceptable for you.  Keep your blood pressure level in the range that you and your health care provider have determined acceptable for you.  Eat a well-balanced diet.  Be active every day.  Check your feet every day. SEEK MEDICAL CARE IF:   You have burning, stabbing, or aching pain in the legs or feet.  You are unable to feel pressure or pain in your feet.  You develop problems with digestion such as:  Nausea.  Vomiting.  Bloating.  Constipation.  Diarrhea.  Abdominal pain.  You have difficulty with urination, such as:  Incontinence.  Retention.  You have palpitations.  You develop orthostatic hypotension. When you stand up you may feel:  Dizzy.  Weak.  Faint.  You cannot attain and maintain an erection (in men).  You have vaginal dryness and problems with decreased sexual desire and arousal (in women).  You have severe pain in your thighs, legs, or buttocks.  You have unexplained weight loss.   Document Released: 03/19/2001 Document Revised: 10/29/2012 Document Reviewed: 06/19/2012 [employer] Patient Information 2015 [employer]. This information is not intended to replace advice given to you by your health care provider. Make sure you discuss any questions you have with your health care provider. 

## 2013-10-08 NOTE — ED Notes (Signed)
Pt reports he is out of his xanax, gabapentin, and lisinopril. Hasn't attempted to get into primary care

## 2013-10-08 NOTE — ED Provider Notes (Signed)
Medical screening examination/treatment/procedure(s) were performed by non-physician practitioner and as supervising physician I was immediately available for consultation/collaboration.    Linwood Dibbles, MD 10/08/13 [phone]

## 2013-11-13 ENCOUNTER — Ambulatory Visit (INDEPENDENT_AMBULATORY_CARE_PROVIDER_SITE_OTHER): Payer: Medicaid Other | Admitting: Podiatry

## 2013-11-13 ENCOUNTER — Encounter: Payer: Self-pay | Admitting: Podiatry

## 2013-11-13 VITALS — BP 140/85 | HR 83 | Ht 78.0 in | Wt 251.0 lb

## 2013-11-13 DIAGNOSIS — B351 Tinea unguium: Secondary | ICD-10-CM

## 2013-11-13 DIAGNOSIS — M79606 Pain in leg, unspecified: Secondary | ICD-10-CM

## 2013-11-13 DIAGNOSIS — M2040 Other hammer toe(s) (acquired), unspecified foot: Secondary | ICD-10-CM

## 2013-11-13 DIAGNOSIS — G629 Polyneuropathy, unspecified: Secondary | ICD-10-CM
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
## 2013-11-13 NOTE — Patient Instructions (Signed)
Seen for painful feet. All nails debrided. Return in 3 months.

## 2013-11-22 ENCOUNTER — Emergency Department (HOSPITAL_BASED_OUTPATIENT_CLINIC_OR_DEPARTMENT_OTHER)
Admission: EM | Admit: 2013-11-22 | Discharge: 2013-11-22 | Disposition: A | Discharge status: Home / Self Care | Payer: Medicaid Other | Attending: Emergency Medicine | Admitting: Emergency Medicine

## 2013-11-22 ENCOUNTER — Encounter (HOSPITAL_BASED_OUTPATIENT_CLINIC_OR_DEPARTMENT_OTHER): Payer: Self-pay | Admitting: *Deleted

## 2013-11-22 DIAGNOSIS — J020 Streptococcal pharyngitis: Secondary | ICD-10-CM | POA: Insufficient documentation

## 2013-11-22 DIAGNOSIS — J029 Acute pharyngitis, unspecified: Secondary | ICD-10-CM | POA: Diagnosis present

## 2013-11-22 DIAGNOSIS — I10 Essential (primary) hypertension: Secondary | ICD-10-CM | POA: Diagnosis not present

## 2013-11-22 DIAGNOSIS — Z7982 Long term (current) use of aspirin: Secondary | ICD-10-CM | POA: Diagnosis not present

## 2013-11-22 DIAGNOSIS — Z79899 Other long term (current) drug therapy: Secondary | ICD-10-CM | POA: Insufficient documentation

## 2013-11-22 DIAGNOSIS — F419 Anxiety disorder, unspecified: Secondary | ICD-10-CM | POA: Diagnosis not present

## 2013-11-22 DIAGNOSIS — E119 Type 2 diabetes mellitus without complications: Secondary | ICD-10-CM | POA: Insufficient documentation

## 2013-11-22 DIAGNOSIS — Z720 Tobacco use: Secondary | ICD-10-CM | POA: Insufficient documentation

## 2013-11-22 DIAGNOSIS — Z792 Long term (current) use of antibiotics: Secondary | ICD-10-CM | POA: Diagnosis not present

## 2013-11-22 LAB — CBG MONITORING, ED: GLUCOSE-CAPILLARY: 167 mg/dL — AB (ref 70–99)

## 2013-11-22 LAB — RAPID STREP SCREEN (MED CTR MEBANE ONLY): STREPTOCOCCUS, GROUP A SCREEN (DIRECT): POSITIVE — AB

## 2013-11-22 MED ORDER — AMOXICILLIN 500 MG PO CAPS: 500.0000 mg | ORAL_CAPSULE | Freq: Three times a day (TID) | ORAL | Status: AC

## 2013-11-22 MED ORDER — HYDROCODONE-HOMATROPINE 5-1.5 MG/5ML PO SYRP: 5.0000 mL | ORAL_SOLUTION | Freq: Four times a day (QID) | ORAL | Status: AC | PRN

## 2013-11-22 NOTE — Discharge Instructions (Signed)
Your strep screen today is positive. Be sure to take the antibiotic until it is finished. Call and schedule a follow up appointment with your doctor. Take tylenol as needed for fever or pain.

## 2013-11-22 NOTE — ED Provider Notes (Signed)
CSN: [id number]     Arrival date & time 11/22/13  1652 History   First MD Initiated Contact with Patient 11/22/13 1726     Chief Complaint  Patient presents with  . Sore Throat     (Consider location/radiation/quality/duration/timing/severity/associated sxs/prior Treatment) Patient is a 38 y.o. male presenting with pharyngitis. The history is provided by the patient.  Sore Throat   Todd Ayala is a 38 y.o. male who presents to the ED with a sore throat and cough. He states that he was here 10/19 and given Penicillin but he wasn't sure for what so he didn't take it. Three days ago when the sore throat started he started taking the penicillin and took it a few times but not regularly. He reports fever that started yesterday. His symptoms are worse today.   Past Medical History  Diagnosis Date  . Hypertension   . Diabetes mellitus   . Hypercholesteremia   . Anxiety    Past Surgical History  Procedure Laterality Date  . Tibia fracture surgery     No family history on file. History  Substance Use Topics  . Smoking status: Current Every Day Smoker -- 0.50 packs/day    Types: Cigarettes  . Smokeless tobacco: Never Used  . Alcohol Use: Yes     Comment: occasional    Review of Systems Negative except as stated in HPI   Allergies  Review of patient's allergies indicates no known allergies.  Home Medications   Prior to Admission medications   Medication Sig Start Date End Date Taking? Authorizing Provider  acetaminophen-codeine (TYLENOL #3) 300-30 MG per tablet Take 1-2 tablets by mouth every 6 (six) hours as needed for pain. 08/06/12   Derwood Kaplan, MD  ALPRAZolam Prudy Feeler) 0.5 MG tablet Take 1 tablet (0.5 mg total) by mouth at bedtime as needed for sleep. 05/16/13   Rolan Bucco, MD  ALPRAZolam Prudy Feeler) 1 MG tablet Take 1 tablet (1 mg total) by mouth at bedtime as needed for anxiety. 10/08/13   Elson Areas, PA-C  aspirin 325 MG EC tablet Take 81 mg by mouth daily.      Historical Provider, MD  benzonatate (TESSALON) 100 MG capsule Take 1 capsule (100 mg total) by mouth every 8 (eight) hours. 09/04/12   Warnell Forester, MD  fexofenadine (ALLEGRA) 60 MG tablet Take 1 tablet (60 mg total) by mouth 2 (two) times daily. 05/16/13   Rolan Bucco, MD  gabapentin (NEURONTIN) 300 MG capsule Take 1 capsule (300 mg total) by mouth 3 (three) times daily. 10/08/13   Elson Areas, PA-C  glipiZIDE (GLUCOTROL) 5 MG tablet Take 1 tablet (5 mg total) by mouth 2 (two) times daily before a meal. 05/16/13   Rolan Bucco, MD  HYDROcodone-homatropine (HYCODAN) 5-1.5 MG/5ML syrup Take 5 mLs by mouth every 6 (six) hours as needed for cough.    Historical Provider, MD  lisinopril (PRINIVIL,ZESTRIL) 10 MG tablet Take 1 tablet (10 mg total) by mouth daily. 05/16/13   Rolan Bucco, MD  lisinopril (PRINIVIL,ZESTRIL) 20 MG tablet Take 1 tablet (20 mg total) by mouth daily. 10/08/13   Elson Areas, PA-C  metFORMIN (GLUCOPHAGE) 500 MG tablet Take 1 tablet (500 mg total) by mouth 2 (two) times daily with a meal. 05/16/13   Rolan Bucco, MD  penicillin v potassium (VEETID) 500 MG tablet Take 500 mg by mouth 4 (four) times daily.    Historical Provider, MD  promethazine (PHENERGAN) 25 MG tablet Take 1 tablet (25 mg total) by  mouth every 6 (six) hours as needed for nausea. 08/06/12   Derwood KaplanAnkit Nanavati, MD  traMADol (ULTRAM) 50 MG tablet Take 50 mg by mouth every 6 (six) hours as needed.    Historical Provider, MD   BP 134/82 mmHg  Pulse 81  Temp(Src) 98.6 F (37 C) (Oral)  Resp 18  Ht 6\' 6"  (1.981 m)  Wt 253 lb 1 oz (114.788 kg)  BMI 29.25 kg/m2  SpO2 100% Physical Exam  Constitutional: He is oriented to person, place, and time. He appears well-developed and well-nourished. No distress.  HENT:  Head: Normocephalic.  Right Ear: Tympanic membrane normal.  Left Ear: Tympanic membrane normal.  Nose: Nose normal.  Mouth/Throat: Uvula is midline and mucous membranes are normal. Posterior oropharyngeal  erythema present.  Eyes: EOM are normal.  Neck: Neck supple.  Cardiovascular: Normal rate and regular rhythm.   Pulmonary/Chest: Effort normal. No respiratory distress. He has no wheezes. He has no rales.  Abdominal: Soft. There is no tenderness.  Musculoskeletal: Normal range of motion.  Lymphadenopathy:    He has cervical adenopathy.  Neurological: He is alert and oriented to person, place, and time. No cranial nerve deficit.  Skin: Skin is warm and dry.  Psychiatric: He has a normal mood and affect. His behavior is normal.  Nursing note and vitals reviewed.   ED Course  Procedures (including critical care time) Labs Review Results for orders placed or performed during the hospital encounter of 11/22/13 (from the past 24 hour(s))  CBG monitoring, ED     Status: Abnormal   Collection Time: 11/22/13  5:01 PM  Result Value Ref Range   Glucose-Capillary 167 (H) 70 - 99 mg/dL  Rapid strep screen     Status: Abnormal   Collection Time: 11/22/13  5:08 PM  Result Value Ref Range   Streptococcus, Group A Screen (Direct) POSITIVE (A) NEGATIVE     MDM  38 y.o. male with sore throat and dry cough for the past 3 days. Will treat for strep infection. Encouraged patient to take all his antibiotic even if he starts feeling better. Discussed with the patient clinical and lab findings and all questioned fully answered. He will return if any problems arise. Stable for discharge without difficulty swallowing, fever or signs of tonsillar abscess.     Medication List    STOP taking these medications        penicillin v potassium 500 MG tablet  Commonly known as:  VEETID      TAKE these medications        amoxicillin 500 MG capsule  Commonly known as:  AMOXIL  Take 1 capsule (500 mg total) by mouth 3 (three) times daily.     HYDROcodone-homatropine 5-1.5 MG/5ML syrup  Commonly known as:  HYCODAN  Take 5 mLs by mouth every 6 (six) hours as needed for cough.      ASK your doctor about  these medications        acetaminophen-codeine 300-30 MG per tablet  Commonly known as:  TYLENOL #3  Take 1-2 tablets by mouth every 6 (six) hours as needed for pain.     ALPRAZolam 0.5 MG tablet  Commonly known as:  XANAX  Take 1 tablet (0.5 mg total) by mouth at bedtime as needed for sleep.     ALPRAZolam 1 MG tablet  Commonly known as:  XANAX  Take 1 tablet (1 mg total) by mouth at bedtime as needed for anxiety.     aspirin 325 MG  EC tablet  Take 81 mg by mouth daily.     benzonatate 100 MG capsule  Commonly known as:  TESSALON  Take 1 capsule (100 mg total) by mouth every 8 (eight) hours.     fexofenadine 60 MG tablet  Commonly known as:  ALLEGRA  Take 1 tablet (60 mg total) by mouth 2 (two) times daily.     gabapentin 300 MG capsule  Commonly known as:  NEURONTIN  Take 1 capsule (300 mg total) by mouth 3 (three) times daily.     glipiZIDE 5 MG tablet  Commonly known as:  GLUCOTROL  Take 1 tablet (5 mg total) by mouth 2 (two) times daily before a meal.     lisinopril 10 MG tablet  Commonly known as:  PRINIVIL,ZESTRIL  Take 1 tablet (10 mg total) by mouth daily.     lisinopril 20 MG tablet  Commonly known as:  PRINIVIL,ZESTRIL  Take 1 tablet (20 mg total) by mouth daily.     metFORMIN 500 MG tablet  Commonly known as:  GLUCOPHAGE  Take 1 tablet (500 mg total) by mouth 2 (two) times daily with a meal.     promethazine 25 MG tablet  Commonly known as:  PHENERGAN  Take 1 tablet (25 mg total) by mouth every 6 (six) hours as needed for nausea.     traMADol 50 MG tablet  Commonly known as:  ULTRAM  Take 50 mg by mouth every 6 (six) hours as needed.             [facility] Orlene OchM Zaine Elsass, NP 11/23/13 [address]  Moe Graca Orlene OchM Donye Campanelli, NP 11/23/13 [phone]

## 2013-11-22 NOTE — ED Notes (Addendum)
Patient states that he has a sore throat, cough. Taking penicillin that was prescribed on 10/19.

## 2013-12-08 ENCOUNTER — Emergency Department (HOSPITAL_BASED_OUTPATIENT_CLINIC_OR_DEPARTMENT_OTHER)
Admission: EM | Admit: 2013-12-08 | Discharge: 2013-12-08 | Disposition: A | Discharge status: Home / Self Care | Payer: Medicaid Other | Attending: Emergency Medicine | Admitting: Emergency Medicine

## 2013-12-08 ENCOUNTER — Emergency Department (HOSPITAL_BASED_OUTPATIENT_CLINIC_OR_DEPARTMENT_OTHER): Payer: Medicaid Other

## 2013-12-08 ENCOUNTER — Encounter (HOSPITAL_BASED_OUTPATIENT_CLINIC_OR_DEPARTMENT_OTHER): Payer: Self-pay | Admitting: Emergency Medicine

## 2013-12-08 ENCOUNTER — Emergency Department (HOSPITAL_BASED_OUTPATIENT_CLINIC_OR_DEPARTMENT_OTHER)
Admission: EM | Admit: 2013-12-08 | Discharge: 2013-12-08 | Discharge status: Against Medical Advice | Payer: Medicaid Other | Source: Home / Self Care | Attending: Emergency Medicine | Admitting: Emergency Medicine

## 2013-12-08 DIAGNOSIS — Z720 Tobacco use: Secondary | ICD-10-CM | POA: Insufficient documentation

## 2013-12-08 DIAGNOSIS — F419 Anxiety disorder, unspecified: Secondary | ICD-10-CM | POA: Diagnosis not present

## 2013-12-08 DIAGNOSIS — Z79899 Other long term (current) drug therapy: Secondary | ICD-10-CM | POA: Insufficient documentation

## 2013-12-08 DIAGNOSIS — I10 Essential (primary) hypertension: Secondary | ICD-10-CM | POA: Insufficient documentation

## 2013-12-08 DIAGNOSIS — E1140 Type 2 diabetes mellitus with diabetic neuropathy, unspecified: Secondary | ICD-10-CM | POA: Insufficient documentation

## 2013-12-08 DIAGNOSIS — Z760 Encounter for issue of repeat prescription: Secondary | ICD-10-CM | POA: Insufficient documentation

## 2013-12-08 DIAGNOSIS — R059 Cough, unspecified: Secondary | ICD-10-CM

## 2013-12-08 DIAGNOSIS — R05 Cough: Secondary | ICD-10-CM

## 2013-12-08 DIAGNOSIS — Z7982 Long term (current) use of aspirin: Secondary | ICD-10-CM | POA: Diagnosis not present

## 2013-12-08 DIAGNOSIS — Z792 Long term (current) use of antibiotics: Secondary | ICD-10-CM | POA: Insufficient documentation

## 2013-12-08 DIAGNOSIS — J029 Acute pharyngitis, unspecified: Secondary | ICD-10-CM | POA: Diagnosis not present

## 2013-12-08 HISTORY — DX: Type 2 diabetes mellitus with diabetic neuropathy, unspecified: E11.40

## 2013-12-08 NOTE — ED Notes (Signed)
Pt states he cannot stay as he has an appointment at noon.  He related he will return if needed.

## 2013-12-08 NOTE — ED Provider Notes (Signed)
CSN: [id number]     Arrival date & time 12/08/13  1530 History   First MD Initiated Contact with Patient 12/08/13 1544     Chief Complaint  Patient presents with  . Sore Throat     (Consider location/radiation/quality/duration/timing/severity/associated sxs/prior Treatment) Patient is a 38 y.o. male presenting with pharyngitis.  Sore Throat This is a new problem. Episode onset: 2 weeks ago. The problem occurs constantly. The problem has not changed since onset.Pertinent negatives include no chest pain, no abdominal pain, no headaches and no shortness of breath. The symptoms are aggravated by sneezing, swallowing and eating. Nothing relieves the symptoms. He has tried nothing for the symptoms.    Past Medical History  Diagnosis Date  . Hypertension   . Diabetes mellitus   . Hypercholesteremia   . Anxiety   . Diabetic neuropathy    Past Surgical History  Procedure Laterality Date  . Tibia fracture surgery     No family history on file. History  Substance Use Topics  . Smoking status: Current Every Day Smoker -- 0.50 packs/day    Types: Cigarettes  . Smokeless tobacco: Never Used  . Alcohol Use: Yes     Comment: occasional    Review of Systems  Respiratory: Negative for shortness of breath.   Cardiovascular: Negative for chest pain.  Gastrointestinal: Negative for abdominal pain.  Neurological: Negative for headaches.  All other systems reviewed and are negative.     Allergies  Review of patient's allergies indicates no known allergies.  Home Medications   Prior to Admission medications   Medication Sig Start Date End Date Taking? Authorizing Provider  ALPRAZolam Prudy Feeler(XANAX) 1 MG tablet Take 1 tablet (1 mg total) by mouth at bedtime as needed for anxiety. 10/08/13   Elson AreasLeslie K Sofia, PA-C  amoxicillin (AMOXIL) 500 MG capsule Take 1 capsule (500 mg total) by mouth 3 (three) times daily. 11/22/13   Hope Orlene OchM Neese, NP  aspirin 325 MG EC tablet Take 81 mg by mouth daily.      Historical Provider, MD  gabapentin (NEURONTIN) 300 MG capsule Take 1 capsule (300 mg total) by mouth 3 (three) times daily. 10/08/13   Elson AreasLeslie K Sofia, PA-C  glipiZIDE (GLUCOTROL) 5 MG tablet Take 1 tablet (5 mg total) by mouth 2 (two) times daily before a meal. 05/16/13   Rolan BuccoMelanie Belfi, MD  HYDROcodone-homatropine (HYCODAN) 5-1.5 MG/5ML syrup Take 5 mLs by mouth every 6 (six) hours as needed for cough. 11/22/13   Hope Orlene OchM Neese, NP  lisinopril (PRINIVIL,ZESTRIL) 5 MG tablet Take 5 mg by mouth daily.    Historical Provider, MD  metFORMIN (GLUCOPHAGE) 500 MG tablet Take 1 tablet (500 mg total) by mouth 2 (two) times daily with a meal. 05/16/13   Rolan BuccoMelanie Belfi, MD   BP 116/69 mmHg  Pulse 72  Temp(Src) 98.2 F (36.8 C) (Oral)  Resp 20  Ht 6\' 6"  (1.981 m)  Wt 255 lb (115.667 kg)  BMI 29.47 kg/m2  SpO2 98% Physical Exam  Constitutional: He is oriented to person, place, and time. He appears well-developed and well-nourished.  HENT:  Head: Normocephalic and atraumatic.  Right Ear: Tympanic membrane normal.  Left Ear: Tympanic membrane normal.  Mouth/Throat: Uvula is midline. Oropharyngeal exudate (tonsillar with bil edema, no asymmetry) and posterior oropharyngeal erythema present. No posterior oropharyngeal edema.  Eyes: Conjunctivae and EOM are normal.  Neck: Normal range of motion. Neck supple.  Cardiovascular: Normal rate, regular rhythm and normal heart sounds.   Pulmonary/Chest: Effort normal and  breath sounds normal. No respiratory distress.  Abdominal: He exhibits no distension. There is no tenderness. There is no rebound and no guarding.  Musculoskeletal: Normal range of motion.  Neurological: He is alert and oriented to person, place, and time.  Skin: Skin is warm and dry.  Vitals reviewed.   ED Course  Procedures (including critical care time) Labs Review Labs Reviewed - No data to display  Imaging Review Dg Chest 2 View  12/08/2013   CLINICAL DATA:  Cough for 1 month with  upper chest tightness. Feels like something stuck in his chest.  EXAM: CHEST  2 VIEW  COMPARISON:  01/28/2013  FINDINGS: The heart size and mediastinal contours are within normal limits. Both lungs are clear. The visualized skeletal structures are unremarkable.  IMPRESSION: No active cardiopulmonary disease.   Electronically Signed   By: Elige KoHetal  Patel   On: 12/08/2013 16:39     EKG Interpretation None      MDM   Final diagnoses:  Cough  Sore throat  Pharyngitis    38 y.o. male with pertinent PMH of DM presents with continued sore throat in setting of partially completed amox course for strep pharyngitis.  Patient continues to complain of sore throat, no acute worsening symptoms. On her precipitant for the visit today was that he ran out of his pain medication. On arrival today vitals signs and physical exam as above. Patient is significant oropharyngeal erythema with bilateral tonsillar exudate, no signs of peritonsillar or retropharyngeal abscess.  I discussed with the patient that he could have underlying issues, however this time the patient does not show signs of thrush, other emergent pathology of his oropharynx. Chest x-ray obtained and unremarkable. Will have patient follow-up with his primary care doctor for further testing. Do not feel antibiotics or further pain medicine warranted.  No chest pain or dyspnea reported.  1. Sore throat   2. Cough   3. Pharyngitis         Mirian MoMatthew Gentry, MD 12/08/13 [phone]

## 2013-12-08 NOTE — Discharge Instructions (Signed)
Given that you have had 2 weeks of symptoms you should followup with your PCP to rule out unusual problems like HIV or immunosuppression.    Pharyngitis Pharyngitis is redness, pain, and swelling (inflammation) of your pharynx.  CAUSES  Pharyngitis is usually caused by infection. Most of the time, these infections are from viruses (viral) and are part of a cold. However, sometimes pharyngitis is caused by bacteria (bacterial). Pharyngitis can also be caused by allergies. Viral pharyngitis may be spread from person to person by coughing, sneezing, and personal items or utensils (cups, forks, spoons, toothbrushes). Bacterial pharyngitis may be spread from person to person by more intimate contact, such as kissing.  SIGNS AND SYMPTOMS  Symptoms of pharyngitis include:   Sore throat.   Tiredness (fatigue).   Low-grade fever.   Headache.  Joint pain and muscle aches.  Skin rashes.  Swollen lymph nodes.  Plaque-like film on throat or tonsils (often seen with bacterial pharyngitis). DIAGNOSIS  Your health care provider will ask you questions about your illness and your symptoms. Your medical history, along with a physical exam, is often all that is needed to diagnose pharyngitis. Sometimes, a rapid strep test is done. Other lab tests may also be done, depending on the suspected cause.  TREATMENT  Viral pharyngitis will usually get better in 3-4 days without the use of medicine. Bacterial pharyngitis is treated with medicines that kill germs (antibiotics).  HOME CARE INSTRUCTIONS   Drink enough water and fluids to keep your urine clear or pale yellow.   Only take over-the-counter or prescription medicines as directed by your health care provider:   If you are prescribed antibiotics, make sure you finish them even if you start to feel better.   Do not take aspirin.   Get lots of rest.   Gargle with 8 oz of salt water ( tsp of salt per 1 qt of water) as often as every 1-2  hours to soothe your throat.   Throat lozenges (if you are not at risk for choking) or sprays may be used to soothe your throat. SEEK MEDICAL CARE IF:   You have large, tender lumps in your neck.  You have a rash.  You cough up green, yellow-brown, or bloody spit. SEEK IMMEDIATE MEDICAL CARE IF:   Your neck becomes stiff.  You drool or are unable to swallow liquids.  You vomit or are unable to keep medicines or liquids down.  You have severe pain that does not go away with the use of recommended medicines.  You have trouble breathing (not caused by a stuffy nose). MAKE SURE YOU:   Understand these instructions.  Will watch your condition.  Will get help right away if you are not doing well or get worse. Document Released: 01/08/2005 Document Revised: 10/29/2012 Document Reviewed: 09/15/2012 [facility] Patient Information 2015 [employer], [place]. This information is not intended to replace advice given to you by your health care provider. Make sure you discuss any questions you have with your health care provider.

## 2013-12-08 NOTE — ED Notes (Signed)
Pt was seen on 11/22/2013 for strep throat.  Pt states he has lost his Amoxicillin prescription after three days.  Pt continues to have throat pain and requests a refill for his cough syrup and also wants medications refills for some of his home medications. 

## 2013-12-08 NOTE — ED Notes (Addendum)
Pt was seen on 11/22/2013 for strep throat.  Pt states he has lost his Amoxicillin prescription after three days.  Pt continues to have throat pain and requests a refill for his cough syrup and also wants medications refills for some of his home medications.

## 2014-01-29 ENCOUNTER — Ambulatory Visit (INDEPENDENT_AMBULATORY_CARE_PROVIDER_SITE_OTHER): Payer: Medicaid Other | Admitting: Podiatry

## 2014-01-29 ENCOUNTER — Encounter: Payer: Self-pay | Admitting: Podiatry

## 2014-01-29 VITALS — BP 140/70 | HR 81

## 2014-01-29 DIAGNOSIS — B351 Tinea unguium: Secondary | ICD-10-CM

## 2014-01-29 DIAGNOSIS — M79606 Pain in leg, unspecified: Secondary | ICD-10-CM

## 2014-01-29 DIAGNOSIS — G629 Polyneuropathy, unspecified: Secondary | ICD-10-CM

## 2014-01-29 NOTE — Patient Instructions (Signed)
Thick dystrophic nails x 10. All nails debrided. Return in 3 months.

## 2014-01-29 NOTE — Progress Notes (Signed)
Subjective: 39 year old male presents complaining of feet are getting dark. Both feet burning like bees stinging.  Diabetic and neuropathy since 2009. His feet hurts all the time with Neuropathy.  A1c is in 8. He is on Neurontin.    Objective: Dermatologic: Thick dystrophic nails x 10. No open lesions. Dark discolored skin around thick toe nails on both first and 2nd digits bilateral.  Vascular: All pedal pulses are palpable.  Neurologic: Fail to respond to Monofilament sensory testing bilateral. 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 Bilateral bunion L>R.  Assessment: Onychomycosis x 10/.  Diabetic neuropathy. 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  Plan: Reviewed findings. All nails debrided. Diabetic foot care discussed.

## 2014-02-12 ENCOUNTER — Ambulatory Visit: Payer: Medicaid Other | Admitting: Podiatry

## 2014-04-30 ENCOUNTER — Ambulatory Visit: Payer: Medicaid Other | Admitting: Podiatry

## 2015-03-11 IMAGING — CT CT ABD-PELV W/O CM
2 of 4 series · 16 of 46 positions shown, 18 images · non-contrast
Comparison: None.

CLINICAL DATA: Right flank pain

CT ABDOMEN AND PELVIS WITHOUT CONTRAST
TECHNIQUE: Multidetector CT imaging of the abdomen and pelvis was
performed following the standard protocol without intravenous
contrast.

[Series 2: renal stone > 200 lbs 5.0 b31f · axial · 0.77mm/px · z∈[+772,+1217]mm · 13 of 99 slices shown, 15 images]
[im 5/99  soft-tissue]
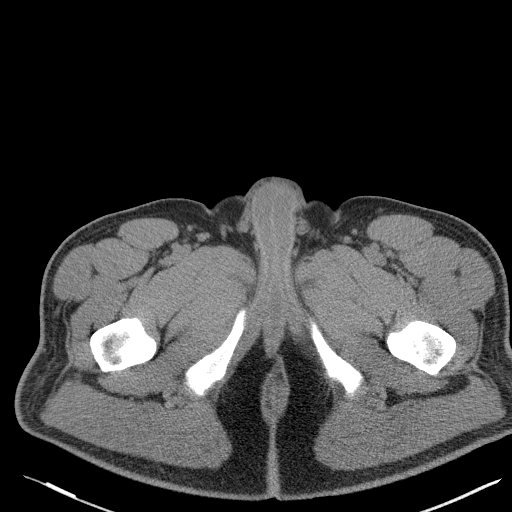
[im 5/99  bone]
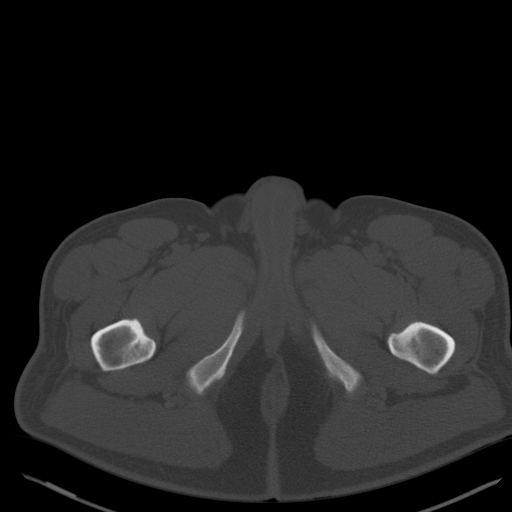
[im 13/99  soft-tissue]
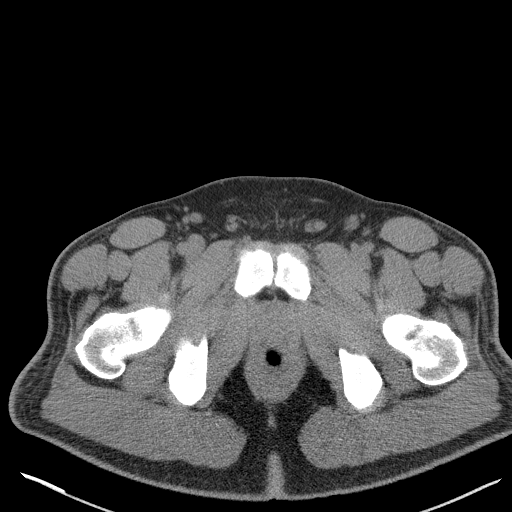
[im 22/99  soft-tissue]
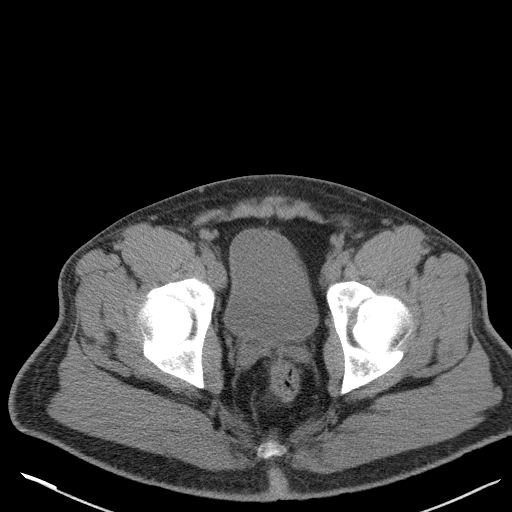
[im 26/99  soft-tissue]
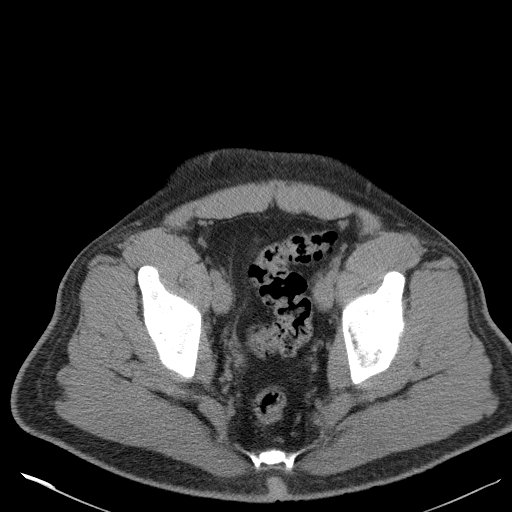
[im 35/99  soft-tissue]
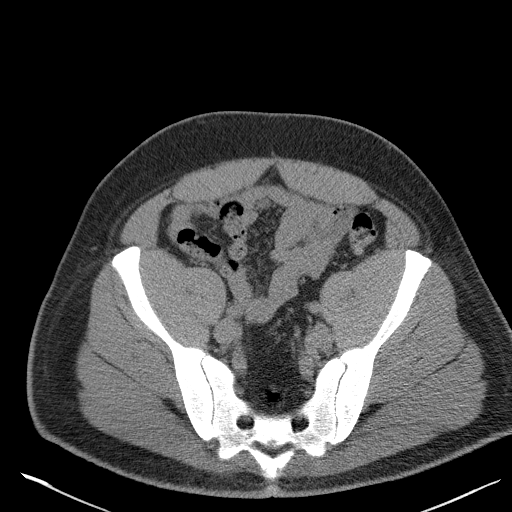
[im 43/99  soft-tissue]
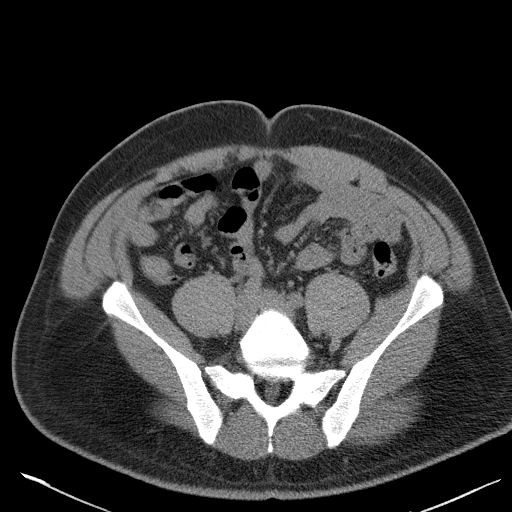
[im 52/99  soft-tissue]
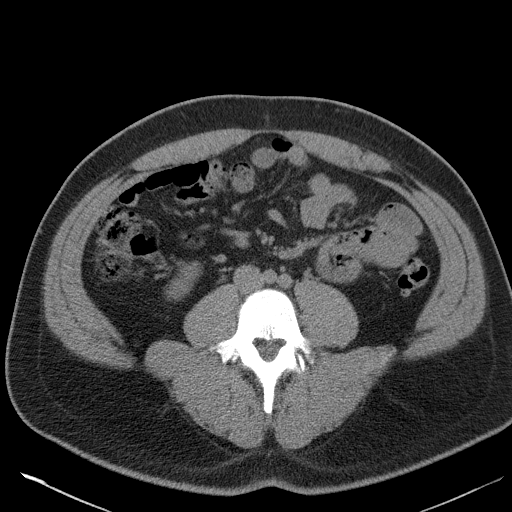
[im 56/99  soft-tissue]
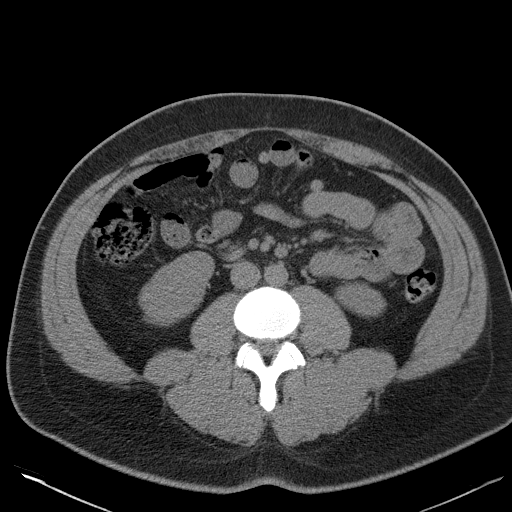
[im 64/99  soft-tissue]
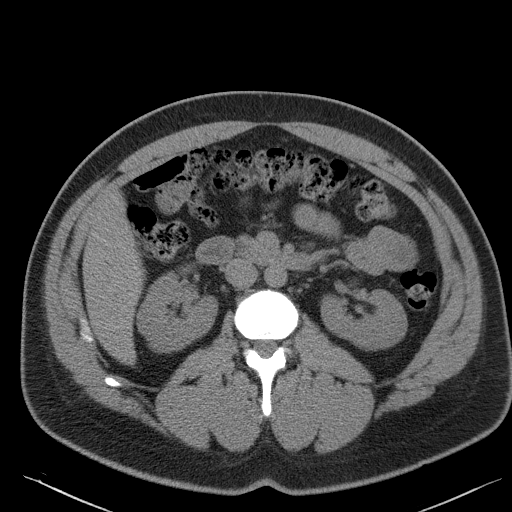
[im 64/99  bone]
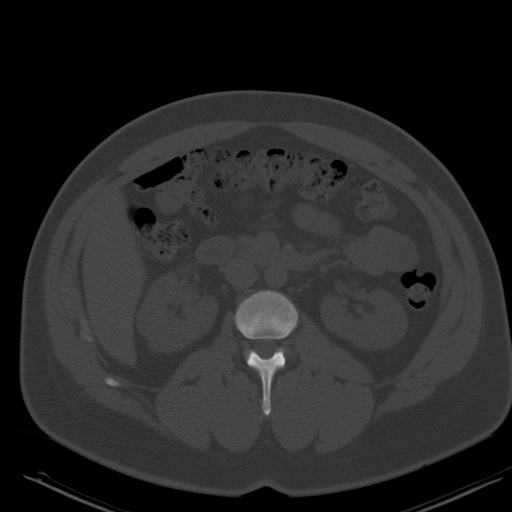
[im 73/99  soft-tissue]
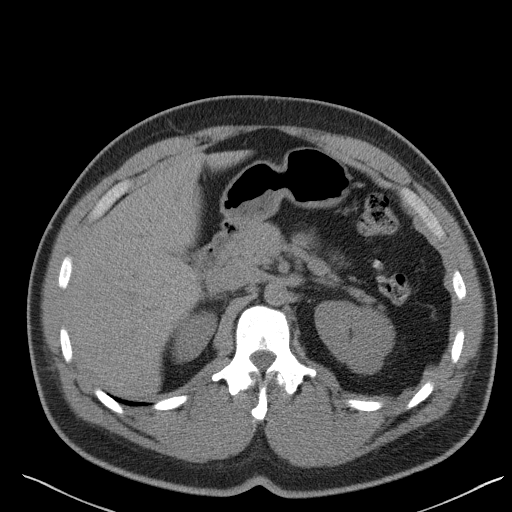
[im 77/99  soft-tissue]
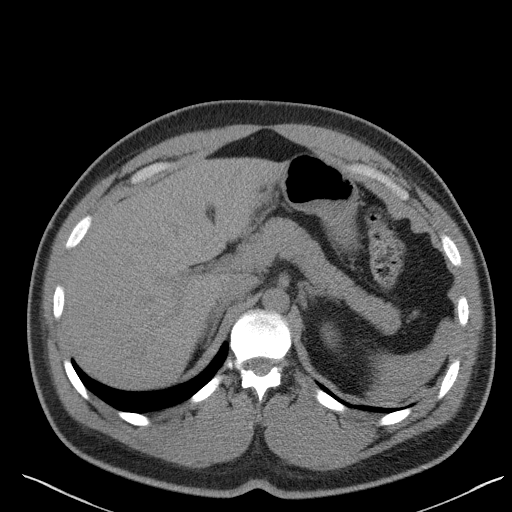
[im 86/99  soft-tissue]
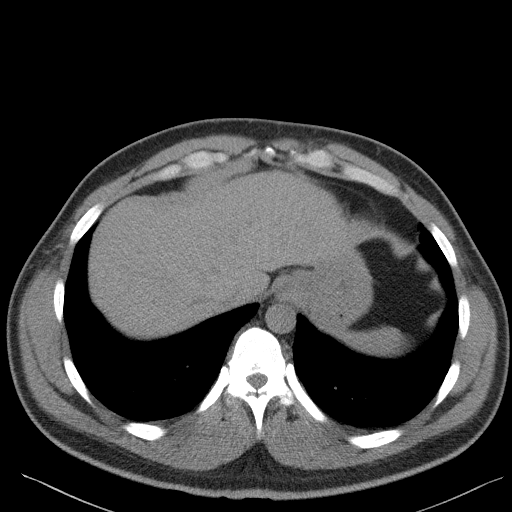
[im 94/99  soft-tissue]
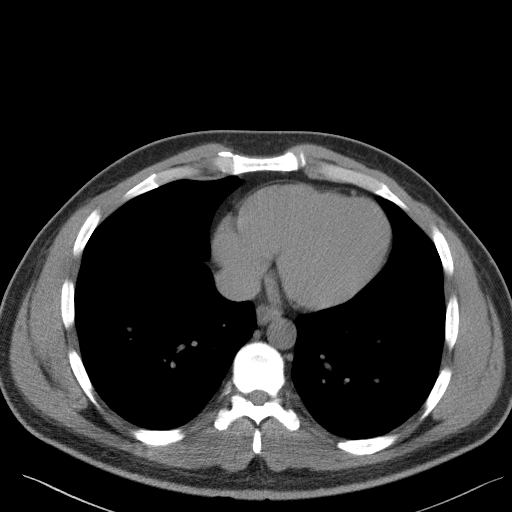

[Series 5: renal stone 3.0 coronal · coronal · 0.95mm/px · 3 of 101 slices shown]
[im 34/101  soft-tissue]
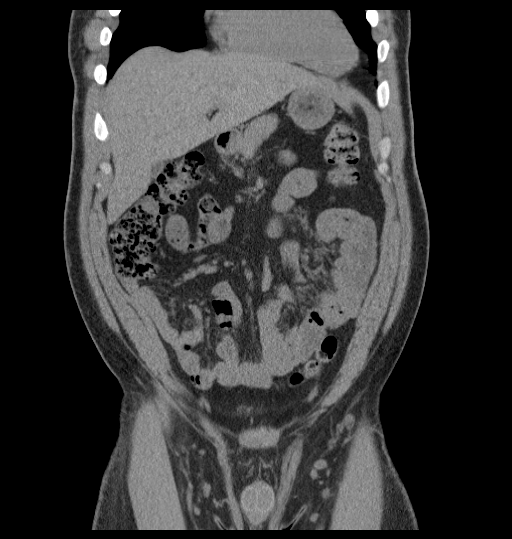
[im 45/101  soft-tissue]
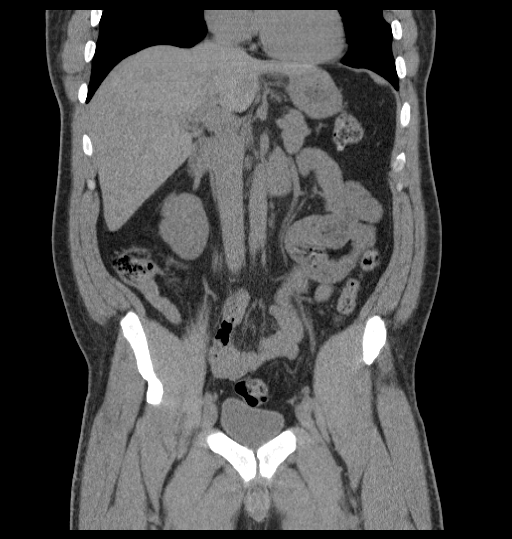
[im 56/101  soft-tissue]
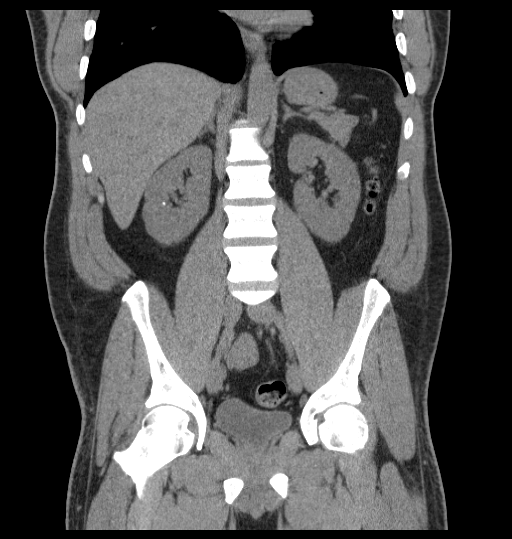

[16 of 46 positions shown; findings below may reference images not displayed]

FINDINGS: Lung bases clear.  Normal heart size.  No pericardial or
pleural effusion.  No hiatal hernia.

Abdomen:  Kidneys demonstrate no acute obstruction, hydronephrosis,
or perinephric inflammatory process.  Ureters are symmetric and
decompressed.  No hydroureter or ureteral calculus on either side.
Punctate sub-centimeter nonobstructing intrarenal calculus in the
right kidney lower pole.

Liver, collapsed gallbladder, biliary system, pancreas, spleen, and
adrenal glands are within normal limits for noncontrast study and
demonstrate no acute process.

Negative for bowel obstruction, dilatation, ileus, or free air.
Normal appendix.

No abdominal free fluid, fluid collection, hemorrhage, adenopathy,
or abscess.

Scattered colonic diverticulosis.

Pelvis:  No pelvic free fluid, fluid collection, hemorrhage,
adenopathy, inguinal abnormality, or hernia.  Urinary bladder
unremarkable.  Pelvic calcifications consistent with venous
phleboliths.  No acute distal bowel process.

No acute osseous finding.  Left ilium demonstrates a hypodense
lytic or cystic bone lesion with well-circumscribed margins
measures 15 mm, image 67.  Suspect incidental bone cyst.  Intact
overlying cortex.  No other osseous abnormality.  Prominent
Schmorl's node noted at the L4 inferior endplate.
IMPRESSION: No acute obstructing ureteral or urinary tract calculi.  No acute
hydronephrosis or obstructive uropathy.

Incidental punctate sub-centimeter right intrarenal calculus.

Colonic diverticulosis without acute inflammatory process

Normal appendix

No acute intra-abdominal or pelvic finding

Nonspecific 15 mm left iliac lytic or lucent bone lesion, suspect
benign.

## 2021-11-03 ENCOUNTER — Other Ambulatory Visit: Payer: Self-pay

## 2021-11-03 ENCOUNTER — Emergency Department (HOSPITAL_BASED_OUTPATIENT_CLINIC_OR_DEPARTMENT_OTHER)
Admission: EM | Admit: 2021-11-03 | Discharge: 2021-11-03 | Disposition: A | Discharge status: Home / Self Care | Payer: Medicaid Other | Attending: Emergency Medicine | Admitting: Emergency Medicine

## 2021-11-03 ENCOUNTER — Emergency Department (HOSPITAL_BASED_OUTPATIENT_CLINIC_OR_DEPARTMENT_OTHER): Payer: Medicaid Other

## 2021-11-03 ENCOUNTER — Encounter (HOSPITAL_BASED_OUTPATIENT_CLINIC_OR_DEPARTMENT_OTHER): Payer: Self-pay

## 2021-11-03 DIAGNOSIS — Z7982 Long term (current) use of aspirin: Secondary | ICD-10-CM | POA: Insufficient documentation

## 2021-11-03 DIAGNOSIS — S20309A Unspecified superficial injuries of unspecified front wall of thorax, initial encounter: Secondary | ICD-10-CM | POA: Diagnosis present

## 2021-11-03 DIAGNOSIS — S299XXA Unspecified injury of thorax, initial encounter: Secondary | ICD-10-CM

## 2021-11-03 DIAGNOSIS — W19XXXA Unspecified fall, initial encounter: Secondary | ICD-10-CM

## 2021-11-03 DIAGNOSIS — X509XXA Other and unspecified overexertion or strenuous movements or postures, initial encounter: Secondary | ICD-10-CM | POA: Diagnosis not present

## 2021-11-03 MED ORDER — MELOXICAM 7.5 MG PO TABS: 7.5000 mg | ORAL_TABLET | Freq: Every day | ORAL | 0 refills | Status: AC

## 2021-11-03 MED ORDER — ACETAMINOPHEN 500 MG PO TABS
1000.0000 mg | ORAL_TABLET | Freq: Once | ORAL | Status: AC
  Administered 2021-11-03: 1000 mg via ORAL
  Filled 2021-11-03: qty 2

## 2021-11-03 MED ORDER — LIDOCAINE 5 % EX PTCH: 1.0000 | MEDICATED_PATCH | CUTANEOUS | 0 refills | Status: AC

## 2021-11-03 NOTE — ED Provider Notes (Signed)
[facility] EMERGENCY DEPARTMENT Provider Note   CSN: [id number] Arrival date & time: 11/03/21  1049    History  Chief Complaint  Patient presents with   Rib Injury    Todd Ayala is a 46 y.o. male here for evaluation of left rib pain Began 1.5 weeks ago. Occurred after playing with children causing him to fall. Felt a pain to left anterior ribs. Worse when laying on it and taking a deep breath.  If he lays flat his pain improves.  He has not taken anything for pain at this time.  No abdominal pain, shortness of breath, numbness, weakness, back pain. No fever, le swelling. No meds PTA.  HPI     Home Medications Prior to Admission medications   Medication Sig Start Date End Date Taking? Authorizing Provider  lidocaine (LIDODERM) 5 % Place 1 patch onto the skin daily. Remove & Discard patch within 12 hours or as directed by MD 11/03/21  Yes Philbert Ocallaghan A, PA-C  meloxicam (MOBIC) 7.5 MG tablet Take 1 tablet (7.5 mg total) by mouth daily. 11/03/21  Yes Gray Maugeri A, PA-C  ALPRAZolam (XANAX) 1 MG tablet Take 1 tablet (1 mg total) by mouth at bedtime as needed for anxiety. 10/08/13   Elson Areas, PA-C  amoxicillin (AMOXIL) 500 MG capsule Take 1 capsule (500 mg total) by mouth 3 (three) times daily. 11/22/13   Janne Napoleon, NP  aspirin 325 MG EC tablet Take 81 mg by mouth daily.     [provider]  gabapentin (NEURONTIN) 300 MG capsule Take 1 capsule (300 mg total) by mouth 3 (three) times daily. 10/08/13   Elson Areas, PA-C  glipiZIDE (GLUCOTROL) 5 MG tablet Take 1 tablet (5 mg total) by mouth 2 (two) times daily before a meal. 05/16/13   Rolan Bucco, MD  HYDROcodone-homatropine (HYCODAN) 5-1.5 MG/5ML syrup Take 5 mLs by mouth every 6 (six) hours as needed for cough. 11/22/13   Janne Napoleon, NP  lisinopril (PRINIVIL,ZESTRIL) 5 MG tablet Take 5 mg by mouth daily.    [provider]  metFORMIN (GLUCOPHAGE) 500 MG tablet Take 1 tablet (500  mg total) by mouth 2 (two) times daily with a meal. 05/16/13   Rolan Bucco, MD      Allergies    Patient has no known allergies.    Review of Systems   Review of Systems  Constitutional: Negative.   HENT: Negative.    Respiratory: Negative.    Cardiovascular:  Positive for chest pain (left, anterior with palpation).  Gastrointestinal: Negative.   Genitourinary: Negative.   Musculoskeletal: Negative.   Skin: Negative.   Neurological: Negative.   All other systems reviewed and are negative.   Physical Exam Updated Vital Signs BP 129/78   Pulse 78   Temp 98.9 F (37.2 C) (Oral)   Resp 17   Ht 6\' 6"  (1.981 m)   Wt 115.7 kg   SpO2 99%   BMI 29.47 kg/m  Physical Exam Vitals and nursing note reviewed.  Constitutional:      General: He is not in acute distress.    Appearance: He is well-developed. He is not ill-appearing, toxic-appearing or diaphoretic.  HENT:     Head: Atraumatic.  Eyes:     Pupils: Pupils are equal, round, and reactive to light.  Cardiovascular:     Rate and Rhythm: Normal rate and regular rhythm.     Pulses: Normal pulses.     Heart sounds: Normal heart  sounds.  Pulmonary:     Effort: Pulmonary effort is normal. No respiratory distress.     Breath sounds: Normal breath sounds.     Comments: Clear bil, speaks in full sentences without difficulty Chest:     Chest wall: Tenderness present. No mass, lacerations, deformity, swelling, crepitus or edema. There is no dullness to percussion.       Comments: Tenderness to anterior lower chest wall without crepitus, step off. No overlying skin changes Abdominal:     General: Bowel sounds are normal. There is no distension.     Palpations: Abdomen is soft.     Tenderness: There is no abdominal tenderness. There is no right CVA tenderness, left CVA tenderness, guarding or rebound.     Comments: Soft non tender. No skin changes over abd. Non tender specifically over spleen  Musculoskeletal:        General:  Normal range of motion.     Cervical back: Normal range of motion and neck supple.     Comments: No bony tenderness, soft  Skin:    General: Skin is warm and dry.     Capillary Refill: Capillary refill takes less than 2 seconds.     Comments: No edema, erythema or warmth.  No fluctuance or induration.  Neurological:     General: No focal deficit present.     Mental Status: He is alert and oriented to person, place, and time.     ED Results / Procedures / Treatments   Labs (all labs ordered are listed, but only abnormal results are displayed) Labs Reviewed - No data to display  EKG None  Radiology DG Ribs Unilateral W/Chest Left  Result Date: 11/03/2021 CLINICAL DATA:  fall 1.5 weeks ago, pain to ribs EXAM: LEFT RIBS AND CHEST - 3+ VIEW COMPARISON:  Radiograph 12/30/2015 FINDINGS: Unchanged cardiomediastinal silhouette. There is no focal airspace consolidation. There is no pleural effusion. No pneumothorax. No acute osseous abnormality, specifically no evidence of acute displaced rib fracture. IMPRESSION: No evidence of acute displaced rib fracture. No acute cardiopulmonary disease. Electronically Signed   By: Caprice Renshaw M.D.   On: 11/03/2021 12:35    Procedures Procedures    Medications Ordered in ED Medications  acetaminophen (TYLENOL) tablet 1,000 mg (1,000 mg Oral Given 11/03/21 1144)    ED Course/ Medical Decision Making/ A&P    46 year old here for evaluation of left anterior rib pain after roughhousing with some children.  Pain to left anterior ribs.  Better when laying flat.  Worse with moving, laying on left side, deep breathing.  He has no exertional chest pain.  No clinical evidence of VTE.  He is PERC negative.  Pain reproducible on exam.  No crepitus, step-off, no concern for flail chest.  His lungs are clear bilaterally.  He is nontender to his abdomen I have low suspicion for splenic or acute intra-abdominal injury.  Not take any medication at home.  We will plan  on imaging, EKG  Imaging and EKG personally viewed and interpreted:  EKG without ischemic changes Chest x-ray without fx, pneumothorax, cardiomegaly, pulm edema, infiltrates  Patient reassessed.  I discussed his EKG and chest x-ray.  Likely MSK in etiology.  Low suspicion for occult fracture.  I have low suspicion for pneumothorax, hemothorax, acute intra-abdominal etiology, ACS, PE, dissection of his symptoms.  DC home symptomatic management, NSAIDs, lidocaine patches.  Encourage alternate ice and heat at home.  He will follow with PCP, return for new or worsening symptoms.  The patient has been appropriately medically screened and/or stabilized in the ED. I have low suspicion for any other emergent medical condition which would require further screening, evaluation or treatment in the ED or require inpatient management.  Patient is hemodynamically stable and in no acute distress.  Patient able to ambulate in department prior to ED.  Evaluation does not show acute pathology that would require ongoing or additional emergent interventions while in the emergency department or further inpatient treatment.  I have discussed the diagnosis with the patient and answered all questions.  Pain is been managed while in the emergency department and patient has no further complaints prior to discharge.  Patient is comfortable with plan discussed in room and is stable for discharge at this time.  I have discussed strict return precautions for returning to the emergency department.  Patient was encouraged to follow-up with PCP/specialist refer to at discharge.                            Medical Decision Making Amount and/or Complexity of Data Reviewed External Data Reviewed: labs, radiology, ECG and notes. Radiology: ordered and independent interpretation performed. Decision-making details documented in ED Course. ECG/medicine tests: ordered and independent interpretation performed. Decision-making details  documented in ED Course.  Risk OTC drugs. Prescription drug management. Decision regarding hospitalization. Diagnosis or treatment significantly limited by social determinants of health.           Final Clinical Impression(s) / ED Diagnoses Final diagnoses:  Fall, initial encounter  Rib injury    Rx / DC Orders ED Discharge Orders          Ordered    meloxicam (MOBIC) 7.5 MG tablet  Daily        11/03/21 1253    lidocaine (LIDODERM) 5 %  Every 24 hours        11/03/21 1253              Laynee Lockamy A, PA-C 11/03/21 1254    Ezequiel Essex, MD 11/03/21 1520

## 2021-11-03 NOTE — ED Triage Notes (Signed)
Reports was playing around with multiple children and he fell and they landing on him and he felt a pop in his left rib cage 1.5 weeks ago Complains of continued pain if laying on it and pain when he takes a deep breath.

## 2021-11-03 NOTE — Discharge Instructions (Signed)
Alternate between ice and heat to chest wall.  May use lidocaine pain patches.  Also take anti-inflammatories over the next week or so.  Alternate with Tylenol.  Return for new or worsening symptoms otherwise follow-up PCP

## 2021-11-03 NOTE — ED Notes (Addendum)
Pain on left side of ribs .States that he hear something pop . When his daughter fell on him. Reports shortness of breath sometime .

## 2022-01-11 ENCOUNTER — Encounter (HOSPITAL_BASED_OUTPATIENT_CLINIC_OR_DEPARTMENT_OTHER): Payer: Self-pay | Admitting: Emergency Medicine

## 2022-01-11 ENCOUNTER — Other Ambulatory Visit: Payer: Self-pay

## 2022-01-11 ENCOUNTER — Emergency Department (HOSPITAL_BASED_OUTPATIENT_CLINIC_OR_DEPARTMENT_OTHER)
Admission: EM | Admit: 2022-01-11 | Discharge: 2022-01-11 | Discharge status: Against Medical Advice | Payer: Medicaid Other | Attending: Emergency Medicine | Admitting: Emergency Medicine

## 2022-01-11 DIAGNOSIS — R197 Diarrhea, unspecified: Secondary | ICD-10-CM | POA: Insufficient documentation

## 2022-01-11 DIAGNOSIS — Z5321 Procedure and treatment not carried out due to patient leaving prior to being seen by health care provider: Secondary | ICD-10-CM | POA: Insufficient documentation

## 2022-01-11 DIAGNOSIS — R1013 Epigastric pain: Secondary | ICD-10-CM | POA: Insufficient documentation

## 2022-01-11 LAB — URINALYSIS, ROUTINE W REFLEX MICROSCOPIC
Bilirubin Urine: NEGATIVE
Glucose, UA: NEGATIVE mg/dL
Ketones, ur: NEGATIVE mg/dL
Leukocytes,Ua: NEGATIVE
Nitrite: NEGATIVE
Protein, ur: NEGATIVE mg/dL
Specific Gravity, Urine: 1.025 (ref 1.005–1.030)
pH: 5.5 (ref 5.0–8.0)

## 2022-01-11 LAB — COMPREHENSIVE METABOLIC PANEL
ALT: 27 U/L (ref 0–44)
AST: 21 U/L (ref 15–41)
Albumin: 4 g/dL (ref 3.5–5.0)
Alkaline Phosphatase: 40 U/L (ref 38–126)
Anion gap: 6 (ref 5–15)
BUN: 14 mg/dL (ref 6–20)
CO2: 24 mmol/L (ref 22–32)
Calcium: 9.3 mg/dL (ref 8.9–10.3)
Chloride: 105 mmol/L (ref 98–111)
Creatinine, Ser: 1.11 mg/dL (ref 0.61–1.24)
GFR, Estimated: >60 mL/min (ref >60)
Glucose, Bld: 226 mg/dL — ABNORMAL HIGH (ref 70–99)
Potassium: 4.3 mmol/L (ref 3.5–5.1)
Sodium: 135 mmol/L (ref 135–145)
Total Bilirubin: 0.7 mg/dL (ref 0.3–1.2)
Total Protein: 7.7 g/dL (ref 6.5–8.1)

## 2022-01-11 LAB — URINALYSIS, MICROSCOPIC (REFLEX): Bacteria, UA: NONE SEEN

## 2022-01-11 LAB — CBC WITH DIFFERENTIAL/PLATELET
Abs Immature Granulocytes: 0.01 10*3/uL (ref 0.00–0.07)
Basophils Absolute: 0.1 10*3/uL (ref 0.0–0.1)
Basophils Relative: 2 %
Eosinophils Absolute: 0.1 10*3/uL (ref 0.0–0.5)
Eosinophils Relative: 3 %
HCT: 45.2 % (ref 39.0–52.0)
Hemoglobin: 15.1 g/dL (ref 13.0–17.0)
Immature Granulocytes: 0 %
Lymphocytes Relative: 36 %
Lymphs Abs: 1.6 10*3/uL (ref 0.7–4.0)
MCH: 28.3 pg (ref 26.0–34.0)
MCHC: 33.4 g/dL (ref 30.0–36.0)
MCV: 84.6 fL (ref 80.0–100.0)
Monocytes Absolute: 0.3 10*3/uL (ref 0.1–1.0)
Monocytes Relative: 6 %
Neutro Abs: 2.3 10*3/uL (ref 1.7–7.7)
Neutrophils Relative %: 53 %
Platelets: 253 10*3/uL (ref 150–400)
RBC: 5.34 MIL/uL (ref 4.22–5.81)
RDW: 12.9 % (ref 11.5–15.5)
WBC: 4.4 10*3/uL (ref 4.0–10.5)
nRBC: 0 % (ref 0.0–0.2)

## 2022-01-11 LAB — LIPASE, BLOOD: Lipase: 29 U/L (ref 11–51)

## 2022-01-11 NOTE — ED Triage Notes (Signed)
States has been having epigastric to mid abd pain since Tuesday. Diarrhea, no vomiting.

## 2022-01-17 ENCOUNTER — Other Ambulatory Visit: Payer: Self-pay

## 2022-01-17 ENCOUNTER — Emergency Department (HOSPITAL_BASED_OUTPATIENT_CLINIC_OR_DEPARTMENT_OTHER)
Admission: EM | Admit: 2022-01-17 | Discharge: 2022-01-17 | Discharge status: Against Medical Advice | Payer: Medicaid Other | Attending: Emergency Medicine | Admitting: Emergency Medicine

## 2022-01-17 DIAGNOSIS — R110 Nausea: Secondary | ICD-10-CM | POA: Diagnosis not present

## 2022-01-17 DIAGNOSIS — R42 Dizziness and giddiness: Secondary | ICD-10-CM | POA: Insufficient documentation

## 2022-01-17 DIAGNOSIS — Z5321 Procedure and treatment not carried out due to patient leaving prior to being seen by health care provider: Secondary | ICD-10-CM | POA: Insufficient documentation

## 2022-01-17 LAB — CBC
HCT: 45.2 % (ref 39.0–52.0)
Hemoglobin: 14.9 g/dL (ref 13.0–17.0)
MCH: 28.2 pg (ref 26.0–34.0)
MCHC: 33 g/dL (ref 30.0–36.0)
MCV: 85.6 fL (ref 80.0–100.0)
Platelets: 249 10*3/uL (ref 150–400)
RBC: 5.28 MIL/uL (ref 4.22–5.81)
RDW: 12.9 % (ref 11.5–15.5)
WBC: 6 10*3/uL (ref 4.0–10.5)
nRBC: 0 % (ref 0.0–0.2)

## 2022-01-17 LAB — URINALYSIS, ROUTINE W REFLEX MICROSCOPIC
Bilirubin Urine: NEGATIVE
Glucose, UA: NEGATIVE mg/dL
Hgb urine dipstick: NEGATIVE
Ketones, ur: NEGATIVE mg/dL
Leukocytes,Ua: NEGATIVE
Nitrite: NEGATIVE
Protein, ur: NEGATIVE mg/dL
Specific Gravity, Urine: 1.025 (ref 1.005–1.030)
pH: 5.5 (ref 5.0–8.0)

## 2022-01-17 LAB — BASIC METABOLIC PANEL
Anion gap: 7 (ref 5–15)
BUN: 15 mg/dL (ref 6–20)
CO2: 28 mmol/L (ref 22–32)
Calcium: 9.1 mg/dL (ref 8.9–10.3)
Chloride: 102 mmol/L (ref 98–111)
Creatinine, Ser: 1.22 mg/dL (ref 0.61–1.24)
GFR, Estimated: >60 mL/min (ref >60)
Glucose, Bld: 224 mg/dL — ABNORMAL HIGH (ref 70–99)
Potassium: 3.7 mmol/L (ref 3.5–5.1)
Sodium: 137 mmol/L (ref 135–145)

## 2022-01-17 NOTE — ED Notes (Signed)
No answer for room X3 

## 2022-01-17 NOTE — ED Triage Notes (Signed)
Patient presents to ED via POV from home. Reports dizziness that began yesterday. Endorses nausea as well.

## 2022-01-17 NOTE — ED Notes (Signed)
Called pt to be room, no answer.
